# Patient Record
Sex: Female | Born: 2000
Health system: Southern US, Community
[De-identification: ages and names within clinical notes are randomized; demographics above are authoritative.]

## PROBLEM LIST (undated history)

## (undated) HISTORY — PX: NO PAST SURGERIES: SHX2092

---
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
## 2018-12-04 ENCOUNTER — Telehealth (INDEPENDENT_AMBULATORY_CARE_PROVIDER_SITE_OTHER): Payer: Self-pay | Admitting: Women's Health

## 2018-12-04 ENCOUNTER — Other Ambulatory Visit: Payer: Self-pay

## 2018-12-04 ENCOUNTER — Encounter: Payer: Self-pay | Admitting: Women's Health

## 2018-12-04 VITALS — Ht 66.0 in | Wt 234.0 lb

## 2018-12-04 DIAGNOSIS — N926 Irregular menstruation, unspecified: Secondary | ICD-10-CM

## 2018-12-04 DIAGNOSIS — N921 Excessive and frequent menstruation with irregular cycle: Secondary | ICD-10-CM

## 2018-12-04 NOTE — Progress Notes (Signed)
   TELEHEALTH VIRTUAL GYN VISIT ENCOUNTER NOTE Patient name: Deanna Harper MRN [id number]  Date of birth: Aug 08, 2000  I connected with patient on 12/04/18 at  2:00 PM EDT by Mychart video and verified that I am speaking with the correct person using two identifiers.  Due to COVID-19 recommendations, pt is not currently in the office.    I discussed the limitations, risks, security and privacy concerns of performing an evaluation and management service by telephone and the availability of in person appointments. I also discussed with the patient that there may be a patient responsible charge related to this service. The patient expressed understanding and agreed to proceed.   Chief Complaint:   consulatation birth control  History of Present Illness:   Deanna Harper is a 18 y.o. G41 Hispanic female being evaluated today to discuss getting IUD to control periods.  Menarche @ 11-12yo, were normally regular, would last 9 days, tampon/pads q 30-12min- was put on COCs 39yrs ago to control.  Has been on continuous COCs x 3 yrs and has been having 2 week periods, this last one has been 7 weeks, and still on it. +cramping, clots quarter-sized. Sexually active.   Patient's last menstrual period was 10/24/2018. The current method of family planning is OCP (estrogen/progesterone).  Last pap <21yo. Results were:  n/a Review of Systems:   Pertinent items are noted in HPI Denies fever/chills, dizziness, headaches, visual disturbances, fatigue, shortness of breath, chest pain, abdominal pain, vomiting, abnormal vaginal discharge/itching/odor/irritation, problems with periods, bowel movements, urination, or intercourse unless otherwise stated above.  Pertinent History Reviewed:  Reviewed past medical,surgical, social, obstetrical and family history.  Reviewed problem list, medications and allergies. Physical Assessment:   Vitals:   12/04/18 1423  Weight: 234 lb (106.1 kg)  Height: 5\' 6"  (1.676 m)  Body  mass index is 37.77 kg/m.       Physical Examination:   General:  Alert, oriented and cooperative.   Mental Status: Normal mood and affect perceived. Normal judgment and thought content.  Physical exam deferred due to nature of the encounter  No results found for this or any previous visit (from the past 24 hour(s)).  Assessment & Plan:  1) Heavy prolonged periods> wants Mirena, on period now, will try to get in asap, if period stops prior to appt call and reschedule for when on period  Meds: No orders of the defined types were placed in this encounter.   No orders of the defined types were placed in this encounter.   I discussed the assessment and treatment plan with the patient. The patient was provided an opportunity to ask questions and all were answered. The patient agreed with the plan and demonstrated an understanding of the instructions.   The patient was advised to call back or seek an in-person evaluation/go to the ED if the symptoms worsen or if the condition fails to improve as anticipated.  I provided 15 minutes of non-face-to-face time during this encounter.   Return for asap for , IUD insertion.  [place], [facility] 12/04/2018 2:39 PM

## 2018-12-17 ENCOUNTER — Other Ambulatory Visit: Payer: Self-pay

## 2018-12-17 ENCOUNTER — Ambulatory Visit (INDEPENDENT_AMBULATORY_CARE_PROVIDER_SITE_OTHER): Payer: Medicaid Other | Admitting: Women's Health

## 2018-12-17 ENCOUNTER — Encounter: Payer: Self-pay | Admitting: Women's Health

## 2018-12-17 VITALS — BP 136/77 | HR 106 | Ht 66.0 in | Wt 233.5 lb

## 2018-12-17 DIAGNOSIS — Z30430 Encounter for insertion of intrauterine contraceptive device: Secondary | ICD-10-CM

## 2018-12-17 DIAGNOSIS — Z3202 Encounter for pregnancy test, result negative: Secondary | ICD-10-CM

## 2018-12-17 DIAGNOSIS — N921 Excessive and frequent menstruation with irregular cycle: Secondary | ICD-10-CM | POA: Diagnosis not present

## 2018-12-17 DIAGNOSIS — Z113 Encounter for screening for infections with a predominantly sexual mode of transmission: Secondary | ICD-10-CM

## 2018-12-17 LAB — POCT URINE PREGNANCY: Preg Test, Ur: NEGATIVE

## 2018-12-17 LAB — POCT HEMOGLOBIN: Hemoglobin: 14.7 g/dL — AB (ref 11–14.6)

## 2018-12-17 MED ORDER — LEVONORGESTREL 20 MCG/24HR IU IUD
INTRAUTERINE_SYSTEM | Freq: Once | INTRAUTERINE | Status: AC
  Administered 2018-12-17: 14:00:00 via INTRAUTERINE

## 2018-12-17 NOTE — Patient Instructions (Signed)
 Nothing in vagina for 3 days (no sex, douching, tampons, etc...)  Check your strings once a month to make sure you can feel them, if you are not able to please let us know  If you develop a fever of 100.4 or more in the next few weeks, or if you develop severe abdominal pain, please let us know  Use a backup method of birth control, such as condoms, for 2 weeks    Intrauterine Device Insertion, Care After  This sheet gives you information about how to care for yourself after your procedure. Your health care provider may also give you more specific instructions. If you have problems or questions, contact your health care provider. What can I expect after the procedure? After the procedure, it is common to have:  Cramps and pain in the abdomen.  Light bleeding (spotting) or heavier bleeding that is like your menstrual period. This may last for up to a few days.  Lower back pain.  Dizziness.  Headaches.  Nausea. Follow these instructions at home:  Before resuming sexual activity, check to make sure that you can feel the IUD string(s). You should be able to feel the end of the string(s) below the opening of your cervix. If your IUD string is in place, you may resume sexual activity. ? If you had a hormonal IUD inserted more than 7 days after your most recent period started, you will need to use a backup method of birth control for 7 days after IUD insertion. Ask your health care provider whether this applies to you.  Continue to check that the IUD is still in place by feeling for the string(s) after every menstrual period, or once a month.  Take over-the-counter and prescription medicines only as told by your health care provider.  Do not drive or use heavy machinery while taking prescription pain medicine.  Keep all follow-up visits as told by your health care provider. This is important. Contact a health care provider if:  You have bleeding that is heavier or lasts longer than  a normal menstrual cycle.  You have a fever.  You have cramps or abdominal pain that get worse or do not get better with medicine.  You develop abdominal pain that is new or is not in the same area of earlier cramping and pain.  You feel lightheaded or weak.  You have abnormal or bad-smelling discharge from your vagina.  You have pain during sexual activity.  You have any of the following problems with your IUD string(s): ? The string bothers or hurts you or your sexual partner. ? You cannot feel the string. ? The string has gotten longer.  You can feel the IUD in your vagina.  You think you may be pregnant, or you miss your menstrual period.  You think you may have an STI (sexually transmitted infection). Get help right away if:  You have flu-like symptoms.  You have a fever and chills.  You can feel that your IUD has slipped out of place. Summary  After the procedure, it is common to have cramps and pain in the abdomen. It is also common to have light bleeding (spotting) or heavier bleeding that is like your menstrual period.  Continue to check that the IUD is still in place by feeling for the string(s) after every menstrual period, or once a month.  Keep all follow-up visits as told by your health care provider. This is important.  Contact your health care provider if   you have problems with your IUD string(s), such as the string getting longer or bothering you or your sexual partner. This information is not intended to replace advice given to you by your health care provider. Make sure you discuss any questions you have with your health care provider. Document Released: 12/13/2010 Document Revised: 03/29/2017 Document Reviewed: 03/07/2016 Elsevier Patient Education  2020 Elsevier Inc.  

## 2018-12-17 NOTE — Addendum Note (Signed)
Addended by: Linton Rump on: 12/17/2018 01:43 PM   Modules accepted: Orders

## 2018-12-17 NOTE — Progress Notes (Signed)
   IUD INSERTION Patient name: Deanna Harper MRN [id number]  Date of birth: 13-Oct-2000 Subjective Findings:   Deanna Harper is a 18 y.o. [place] female being seen today for insertion of a Mirena IUD to help control heavy prolonged periods.   Patient's last menstrual period was 10/25/2018. Last sexual intercourse was >2wks ago Last pap <21yo. Results were:  n/a  The risks and benefits of the method and placement have been thouroughly reviewed with the patient and all questions were answered.  Specifically the patient is aware of failure rate of 04/998, expulsion of the IUD and of possible perforation.  The patient is aware of irregular bleeding due to the method and understands the incidence of irregular bleeding diminishes with time.  Signed copy of informed consent in chart.  Pertinent History Reviewed:   Reviewed past medical,surgical, social, obstetrical and family history.  Reviewed problem list, medications and allergies. Objective Findings & Procedure:   Vitals:   12/17/18 1001  BP: 136/77  Pulse: (!) 106  Weight: 233 lb 8 oz (105.9 kg)  Height: 5\' 6"  (1.676 m)  Body mass index is 37.69 kg/m.  Results for orders placed or performed in visit on 12/17/18 (from the past 24 hour(s))  POCT urine pregnancy   Collection Time: 12/17/18 11:35 AM  Result Value Ref Range   Preg Test, Ur Negative Negative  POCT hemoglobin   Collection Time: 12/17/18 12:18 PM  Result Value Ref Range   Hemoglobin 14.7 (A) 11 - 14.6 g/dL     Time out was performed.  A graves speculum was placed in the vagina.  The cervix was visualized, prepped using Betadine, and grasped with a single tooth tenaculum. The uterus was found to be neutral and it sounded to 9 cm.  Mirena  IUD placed per manufacturer's recommendations. The strings were trimmed to approximately 3 cm. The patient tolerated the procedure well.   Informal transvaginal sonogram was performed and the proper placement of the IUD was  verified. Assessment & Plan:   1) Mirena IUD insertion The patient was given post procedure instructions, including signs and symptoms of infection and to check for the strings after each menses or each month, and refraining from intercourse or anything in the vagina for 3 days. She was given a care card with date IUD placed, and date IUD to be removed. She is scheduled for a f/u appointment in 4 weeks.  2) STD screen> gc/ct today  Orders Placed This Encounter  Procedures  . GC/Chlamydia Probe Amp  . POCT urine pregnancy  . POCT hemoglobin    Return in about 4 weeks (around 01/14/2019) for F/U, MyChart Video.  [place], [facility] 12/17/2018 1:29 PM

## 2018-12-20 LAB — GC/CHLAMYDIA PROBE AMP
Chlamydia trachomatis, NAA: NEGATIVE
Neisseria Gonorrhoeae by PCR: NEGATIVE

## 2019-01-14 ENCOUNTER — Encounter: Payer: Self-pay | Admitting: Women's Health

## 2019-01-14 ENCOUNTER — Telehealth (INDEPENDENT_AMBULATORY_CARE_PROVIDER_SITE_OTHER): Payer: Medicaid Other | Admitting: Women's Health

## 2019-01-14 ENCOUNTER — Other Ambulatory Visit: Payer: Self-pay

## 2019-01-14 VITALS — Ht 66.0 in | Wt 234.0 lb

## 2019-01-14 DIAGNOSIS — Z30431 Encounter for routine checking of intrauterine contraceptive device: Secondary | ICD-10-CM

## 2019-01-14 NOTE — Progress Notes (Signed)
   TELEHEALTH VIRTUAL GYN VISIT ENCOUNTER NOTE Patient name: Deanna Harper MRN [id number]  Date of birth: 2000/10/21  I connected with patient on 01/14/19 at  9:50 AM EDT by MyChart video  and verified that I am speaking with the correct person using two identifiers.  Due to COVID-19 recommendations, pt is not currently in the office.    I discussed the limitations, risks, security and privacy concerns of performing an evaluation and management service by telephone and the availability of in person appointments. I also discussed with the patient that there may be a patient responsible charge related to this service. The patient expressed understanding and agreed to proceed.   Chief Complaint:   iud check (cramping)  History of Present Illness:   Deanna Harper is a 18 y.o. [place] female being evaluated today for IUD f/u. Had Mirena placed 12/17/18 for heavy prolonged periods. Able to feel strings. Has had intermittent bleeding/spotting and cramping, sometimes cramps are pretty intense. Hasn't had to take anything for them.     Patient's last menstrual period was 12/20/2018. The current method of family planning is IUD.  Last pap <21yo. Results were:  n/a Review of Systems:   Pertinent items are noted in HPI Denies fever/chills, dizziness, headaches, visual disturbances, fatigue, shortness of breath, chest pain, abdominal pain, vomiting, abnormal vaginal discharge/itching/odor/irritation, problems with periods, bowel movements, urination, or intercourse unless otherwise stated above.  Pertinent History Reviewed:  Reviewed past medical,surgical, social, obstetrical and family history.  Reviewed problem list, medications and allergies. Physical Assessment:   Vitals:   01/14/19 1014  Weight: 234 lb (106.1 kg)  Height: 5\' 6"  (1.676 m)  Body mass index is 37.77 kg/m.       Physical Examination:   General:  Alert, oriented and cooperative.   Mental Status: Normal mood and affect  perceived. Normal judgment and thought content.  Physical exam deferred due to nature of the encounter  No results found for this or any previous visit (from the past 24 hour(s)).  Assessment & Plan:  1) IUD f/u> doing well overall, can take up to 33mths for bleeding to improve, take ibuprofen prn cramping, check strings monthly  Meds: No orders of the defined types were placed in this encounter.   No orders of the defined types were placed in this encounter.   I discussed the assessment and treatment plan with the patient. The patient was provided an opportunity to ask questions and all were answered. The patient agreed with the plan and demonstrated an understanding of the instructions.   The patient was advised to call back or seek an in-person evaluation/go to the ED if the symptoms worsen or if the condition fails to improve as anticipated.  I provided 10 minutes of non-face-to-face time during this encounter.   Return for after 2/7 for , Pap & physical.  Roma Schanz CNM, [facility] 01/14/2019 10:28 AM

## 2019-06-16 ENCOUNTER — Encounter: Payer: Self-pay | Admitting: Adult Health

## 2019-06-16 ENCOUNTER — Other Ambulatory Visit: Payer: Self-pay

## 2019-06-16 ENCOUNTER — Ambulatory Visit (INDEPENDENT_AMBULATORY_CARE_PROVIDER_SITE_OTHER): Payer: Self-pay | Admitting: Adult Health

## 2019-06-16 VITALS — BP 151/97 | HR 111 | Ht 66.0 in | Wt 237.4 lb

## 2019-06-16 DIAGNOSIS — N946 Dysmenorrhea, unspecified: Secondary | ICD-10-CM

## 2019-06-16 DIAGNOSIS — N920 Excessive and frequent menstruation with regular cycle: Secondary | ICD-10-CM

## 2019-06-16 DIAGNOSIS — Z975 Presence of (intrauterine) contraceptive device: Secondary | ICD-10-CM | POA: Insufficient documentation

## 2019-06-16 MED ORDER — MEGESTROL ACETATE 40 MG PO TABS: ORAL_TABLET | ORAL | 1 refills | Status: DC

## 2019-06-16 NOTE — Progress Notes (Signed)
  Subjective:     Patient ID: Deanna Harper, female   DOB: 28-Jul-2000, 19 y.o.   MRN: [id number]  HPI Deanna Harper is a 19 year old white female, single, G0P0, in to talk about options, for heavy, long periods has IUD, since 11/2018.  PCP is Dr Sherwood Gambler.   Review of Systems Got IUD 11/2018, and periods have been monthly and last 10-11 days Has period cramps and they start before period No sex lately Started period in 6th grade and has always been heavy, even OCs did not help  Reviewed past medical,surgical, social and family history. Reviewed medications and allergies.     Objective:   Physical Exam BP (!) 151/97 (BP Location: Left Arm, Patient Position: Sitting, Cuff Size: Normal)   Pulse (!) 111   Ht 5\' 6"  (1.676 m)   Wt 237 lb 6.4 oz (107.7 kg)   BMI 38.32 kg/m  Skin warm and dry. Neck: mid line trachea, normal thyroid, good ROM, no lymphadenopathy noted. Lungs: clear to ausculation bilaterally. Cardiovascular: regular rate and rhythm.    Assessment:     1. Menorrhagia with regular cycle Will rx megace Meds ordered this encounter  Medications  . megestrol (MEGACE) 40 MG tablet    Sig: Take 3 x 5 days then 2 x 5 days then 1 daily    Dispense:  45 tablet    Refill:  1    Order Specific Question:   Supervising Provider    Answer:   , LUTHER H [[id number]]   2. IUD (intrauterine device) in place   3. Dysmenorrhea    Plan:     Return in 4 weeks for pap and physical

## 2019-07-10 ENCOUNTER — Other Ambulatory Visit: Payer: Self-pay | Admitting: Adult Health

## 2019-07-15 ENCOUNTER — Other Ambulatory Visit: Payer: Self-pay

## 2019-07-15 ENCOUNTER — Ambulatory Visit (INDEPENDENT_AMBULATORY_CARE_PROVIDER_SITE_OTHER): Payer: Medicaid Other | Admitting: Adult Health

## 2019-07-15 ENCOUNTER — Other Ambulatory Visit (HOSPITAL_COMMUNITY)
Admission: RE | Admit: 2019-07-15 | Discharge: 2019-07-15 | Disposition: A | Discharge status: Home / Self Care | Payer: Medicaid Other | Source: Ambulatory Visit | Attending: Adult Health | Admitting: Adult Health

## 2019-07-15 ENCOUNTER — Encounter: Payer: Self-pay | Admitting: Adult Health

## 2019-07-15 VITALS — BP 153/96 | HR 96 | Ht 66.0 in | Wt 237.0 lb

## 2019-07-15 DIAGNOSIS — Z975 Presence of (intrauterine) contraceptive device: Secondary | ICD-10-CM | POA: Diagnosis not present

## 2019-07-15 DIAGNOSIS — N898 Other specified noninflammatory disorders of vagina: Secondary | ICD-10-CM | POA: Insufficient documentation

## 2019-07-15 DIAGNOSIS — Z01419 Encounter for gynecological examination (general) (routine) without abnormal findings: Secondary | ICD-10-CM | POA: Diagnosis present

## 2019-07-15 DIAGNOSIS — Z113 Encounter for screening for infections with a predominantly sexual mode of transmission: Secondary | ICD-10-CM | POA: Insufficient documentation

## 2019-07-15 DIAGNOSIS — B9689 Other specified bacterial agents as the cause of diseases classified elsewhere: Secondary | ICD-10-CM | POA: Insufficient documentation

## 2019-07-15 DIAGNOSIS — N760 Acute vaginitis: Secondary | ICD-10-CM | POA: Diagnosis not present

## 2019-07-15 DIAGNOSIS — Z3009 Encounter for other general counseling and advice on contraception: Secondary | ICD-10-CM | POA: Insufficient documentation

## 2019-07-15 LAB — POCT WET PREP (WET MOUNT)
Clue Cells Wet Prep Whiff POC: POSITIVE
Trichomonas Wet Prep HPF POC: ABSENT
WBC, Wet Prep HPF POC: POSITIVE

## 2019-07-15 MED ORDER — METRONIDAZOLE 500 MG PO TABS: 500.0000 mg | ORAL_TABLET | Freq: Two times a day (BID) | ORAL | 0 refills | Status: DC

## 2019-07-15 NOTE — Progress Notes (Signed)
Patient ID: Deanna Harper, female   DOB: August 20, 2000, 19 y.o.   MRN: [id number] History of Present Illness:  Deanna Harper is a 19 year old white female, single, G0P0, has IUD in for well woman gyn exam and first pap.Is taking megace for bleeding with IUD and it is better. She has [employer].  PCP is Dr Sherwood Gambler.   Current Medications, Allergies, Past Medical History, Past Surgical History, Family History and Social History were reviewed in [employer] electronic medical record.     Review of Systems:  Patient denies any headaches, hearing loss, fatigue, blurred vision, shortness of breath, chest pain, abdominal pain, problems with bowel movements, urination, or intercourse. No joint pain or mood swings. Bleeding has almost stopped, taking 2 megace daily    Physical Exam:BP (!) 153/96 (BP Location: Left Arm, Patient Position: Sitting, Cuff Size: Normal)   Pulse 96   Ht 5\' 6"  (1.676 m)   Wt 237 lb (107.5 kg)   BMI 38.25 kg/m  General:  Well developed, well nourished, no acute distress Skin:  Warm and dry Neck:  Midline trachea, normal thyroid, good ROM, no lymphadenopathy Lungs; Clear to auscultation bilaterally Breast:  No dominant palpable mass, retraction, or nipple discharge,has bilateral nipple rods  Cardiovascular: Regular rate and rhythm Abdomen:  Soft, non tender, no hepatosplenomegaly Pelvic:  External genitalia is normal in appearance, no lesions.  The vagina is normal in appearance, scant white discharge with odor. Urethra has no lesions or masses. The cervix is smooth, +IUD strings, pap with GC/CHL performed, cervix friable with EC brush.  Uterus is felt to be normal size, shape, and contour.  No adnexal masses or tenderness noted.Bladder is non tender, no masses felt. Wet prep: +WBCs and clue cells, no trich seen  Extremities/musculoskeletal:  No swelling or varicosities noted, no clubbing or cyanosis Psych:  No mood changes, alert and cooperative,seems happy Fall  risk is low PHQ 2 score is 0. Examination chaperoned by Marland Kitchen CMA.   Impression and Plan:  1. Encounter for gynecological examination with Papanicolaou smear of cervix Pap sent Physical in 1 year Pap in 3 if normal  2. Family planning Check HIV and RPR  3. Screening examination for STD (sexually transmitted disease) Check HIV and RPR  4. Vaginal odor Rx flagyl  5. Vaginal discharge Rx flagyl   6. BV (bacterial vaginosis) Rx flagyl 500 mg 1 bid x 7 days, no alcohol, review handout on BV      7. IUD (intrauterine device) in place

## 2019-07-15 NOTE — Patient Instructions (Signed)
Bacterial Vaginosis  Bacterial vaginosis is a vaginal infection that occurs when the normal balance of bacteria in the vagina is disrupted. It results from an overgrowth of certain bacteria. This is the most common vaginal infection among women ages 15-44. Because bacterial vaginosis increases your risk for STIs (sexually transmitted infections), getting treated can help reduce your risk for chlamydia, gonorrhea, herpes, and HIV (human immunodeficiency virus). Treatment is also important for preventing complications in pregnant women, because this condition can cause an early (premature) delivery. What are the causes? This condition is caused by an increase in harmful bacteria that are normally present in small amounts in the vagina. However, the reason that the condition develops is not fully understood. What increases the risk? The following factors may make you more likely to develop this condition:  Having a new sexual partner or multiple sexual partners.  Having unprotected sex.  Douching.  Having an intrauterine device (IUD).  Smoking.  Drug and alcohol abuse.  Taking certain antibiotic medicines.  Being pregnant. You cannot get bacterial vaginosis from toilet seats, bedding, swimming pools, or contact with objects around you. What are the signs or symptoms? Symptoms of this condition include:  Grey or white vaginal discharge. The discharge can also be watery or foamy.  A fish-like odor with discharge, especially after sexual intercourse or during menstruation.  Itching in and around the vagina.  Burning or pain with urination. Some women with bacterial vaginosis have no signs or symptoms. How is this diagnosed? This condition is diagnosed based on:  Your medical history.  A physical exam of the vagina.  Testing a sample of vaginal fluid under a microscope to look for a large amount of bad bacteria or abnormal cells. Your health care provider may use a cotton swab or  a small wooden spatula to collect the sample. How is this treated? This condition is treated with antibiotics. These may be given as a pill, a vaginal cream, or a medicine that is put into the vagina (suppository). If the condition comes back after treatment, a second round of antibiotics may be needed. Follow these instructions at home: Medicines  Take over-the-counter and prescription medicines only as told by your health care provider.  Take or use your antibiotic as told by your health care provider. Do not stop taking or using the antibiotic even if you start to feel better. General instructions  If you have a female sexual partner, tell her that you have a vaginal infection. She should see her health care provider and be treated if she has symptoms. If you have a female sexual partner, he does not need treatment.  During treatment: ? Avoid sexual activity until you finish treatment. ? Do not douche. ? Avoid alcohol as directed by your health care provider. ? Avoid breastfeeding as directed by your health care provider.  Drink enough water and fluids to keep your urine clear or pale yellow.  Keep the area around your vagina and rectum clean. ? Wash the area daily with warm water. ? Wipe yourself from front to back after using the toilet.  Keep all follow-up visits as told by your health care provider. This is important. How is this prevented?  Do not douche.  Wash the outside of your vagina with warm water only.  Use protection when having sex. This includes latex condoms and dental dams.  Limit how many sexual partners you have. To help prevent bacterial vaginosis, it is best to have sex with just one partner (  monogamous).  Make sure you and your sexual partner are tested for STIs.  Wear cotton or cotton-lined underwear.  Avoid wearing tight pants and pantyhose, especially during summer.  Limit the amount of alcohol that you drink.  Do not use any products that contain  nicotine or tobacco, such as cigarettes and e-cigarettes. If you need help quitting, ask your health care provider.  Do not use illegal drugs. Where to find more information  Centers for Disease Control and Prevention: www.cdc.gov/std  American Sexual Health Association (ASHA): www.ashastd.org  U.S. Department of Health and Human Services, Office on Women's Health: www.womenshealth.gov/ or https://www.womenshealth.gov/a-z-topics/bacterial-vaginosis Contact a health care provider if:  Your symptoms do not improve, even after treatment.  You have more discharge or pain when urinating.  You have a fever.  You have pain in your abdomen.  You have pain during sex.  You have vaginal bleeding between periods. Summary  Bacterial vaginosis is a vaginal infection that occurs when the normal balance of bacteria in the vagina is disrupted.  Because bacterial vaginosis increases your risk for STIs (sexually transmitted infections), getting treated can help reduce your risk for chlamydia, gonorrhea, herpes, and HIV (human immunodeficiency virus). Treatment is also important for preventing complications in pregnant women, because the condition can cause an early (premature) delivery.  This condition is treated with antibiotic medicines. These may be given as a pill, a vaginal cream, or a medicine that is put into the vagina (suppository). This information is not intended to replace advice given to you by your health care provider. Make sure you discuss any questions you have with your health care provider. Document Revised: 03/29/2017 Document Reviewed: 12/31/2015 Elsevier Patient Education  2020 Elsevier Inc.  

## 2019-07-16 LAB — HIV ANTIBODY (ROUTINE TESTING W REFLEX): HIV Screen 4th Generation wRfx: NONREACTIVE

## 2019-07-16 LAB — RPR: RPR Ser Ql: NONREACTIVE

## 2019-07-17 LAB — CYTOLOGY - PAP
Chlamydia: NEGATIVE
Comment: NEGATIVE
Comment: NORMAL
Diagnosis: NEGATIVE
Neisseria Gonorrhea: NEGATIVE

## 2019-08-25 ENCOUNTER — Encounter: Payer: Self-pay | Admitting: Adult Health

## 2019-08-25 ENCOUNTER — Ambulatory Visit: Payer: Self-pay | Admitting: Adult Health

## 2019-08-25 ENCOUNTER — Other Ambulatory Visit: Payer: Self-pay

## 2019-08-25 VITALS — BP 126/88 | HR 95 | Ht 66.0 in | Wt 241.0 lb

## 2019-08-25 DIAGNOSIS — N926 Irregular menstruation, unspecified: Secondary | ICD-10-CM | POA: Insufficient documentation

## 2019-08-25 DIAGNOSIS — Z975 Presence of (intrauterine) contraceptive device: Secondary | ICD-10-CM

## 2019-08-25 DIAGNOSIS — N9489 Other specified conditions associated with female genital organs and menstrual cycle: Secondary | ICD-10-CM | POA: Insufficient documentation

## 2019-08-25 MED ORDER — MEGESTROL ACETATE 40 MG PO TABS: ORAL_TABLET | ORAL | 2 refills | Status: DC

## 2019-08-25 NOTE — Progress Notes (Signed)
  Subjective:     Patient ID: Deanna Harper, female   DOB: 26-Mar-2001, 19 y.o.   MRN: [id number]  HPI Deanna Harper is a 19 year old white female,single, G0P0 in complaining of prolonged period and cramping, has IUD.She took megace and it stopped the last episode, but needs refill on megace.She does not want to remove IUD. PCP is Dr Sherwood Gambler.   Review of Systems Prolonged period +cramping  Reviewed past medical,surgical, social and family history. Reviewed medications and allergies.     Objective:   Physical Exam BP 126/88 (BP Location: Left Arm, Patient Position: Sitting, Cuff Size: Normal)   Pulse 95   Ht 5\' 6"  (1.676 m)   Wt 241 lb (109.3 kg)   LMP 08/05/2019   BMI 38.90 kg/m  Skin warm and dry.  Lungs: clear to ausculation bilaterally. Cardiovascular: regular rate and rhythm.    Assessment:     1. Prolonged periods Will rx megace Meds ordered this encounter  Medications  . megestrol (MEGACE) 40 MG tablet    Sig: TAKE 3 TABLETS FOR 5 DAYS, TAKE 2 TABLETS FOR 5 DAYS,TAKE 1 TABLET DAILY    Dispense:  45 tablet    Refill:  2    Order Specific Question:   Supervising Provider    Answer:   10/05/2019, LUTHER H [[id number]]    2. IUD (intrauterine device) in place   3. Uterine cramping     Plan:     Take megace and follow up with me in 6 weeks

## 2019-09-15 ENCOUNTER — Other Ambulatory Visit: Payer: Self-pay | Admitting: Adult Health

## 2019-10-06 ENCOUNTER — Ambulatory Visit (INDEPENDENT_AMBULATORY_CARE_PROVIDER_SITE_OTHER): Payer: Self-pay | Admitting: Adult Health

## 2019-10-06 ENCOUNTER — Encounter: Payer: Self-pay | Admitting: Adult Health

## 2019-10-06 VITALS — BP 98/72 | HR 90 | Ht 66.0 in | Wt 251.0 lb

## 2019-10-06 DIAGNOSIS — Z6841 Body Mass Index (BMI) 40.0 and over, adult: Secondary | ICD-10-CM

## 2019-10-06 DIAGNOSIS — T50905A Adverse effect of unspecified drugs, medicaments and biological substances, initial encounter: Secondary | ICD-10-CM

## 2019-10-06 DIAGNOSIS — R635 Abnormal weight gain: Secondary | ICD-10-CM | POA: Insufficient documentation

## 2019-10-06 DIAGNOSIS — Z975 Presence of (intrauterine) contraceptive device: Secondary | ICD-10-CM

## 2019-10-06 NOTE — Progress Notes (Signed)
  Subjective:     Patient ID: Danella Deis, female   DOB: Jan 01, 2001, 19 y.o.   MRN: [id number]  HPI Davelyn is a 19 year old white female, single, G0P0, back in for bleeding on IUD and started megace and bleeding has stopped but gained weight PCP is Dr Sherwood Gambler.   Review of Systems Bleeding stopped with megace Has gained weight with megace Reviewed past medical,surgical, social and family history. Reviewed medications and allergies.     Objective:   Physical Exam BP 98/72 (BP Location: Left Arm, Patient Position: Sitting, Cuff Size: Large)   Pulse 90   Ht 5\' 6"  (1.676 m)   Wt 251 lb (113.9 kg)   BMI 40.51 kg/m  Skin warm and dry. Neck: mid line trachea, normal thyroid, good ROM, no lymphadenopathy noted. Lungs: clear to ausculation bilaterally. Cardiovascular: regular rate and rhythm. Has gained 10 lbs since 08/25/19    Assessment:     1. Weight gain due to medication Stop megace for now  2. IUD (intrauterine device) in place IF bleeding should return may try OCs to control     Plan:     Follow up prn

## 2019-10-09 ENCOUNTER — Ambulatory Visit (INDEPENDENT_AMBULATORY_CARE_PROVIDER_SITE_OTHER): Payer: Self-pay | Admitting: Nurse Practitioner

## 2019-10-09 ENCOUNTER — Encounter: Payer: Self-pay | Admitting: Nurse Practitioner

## 2019-10-09 VITALS — BP 144/89 | HR 98 | Ht 66.0 in | Wt 251.0 lb

## 2019-10-09 DIAGNOSIS — Z30431 Encounter for routine checking of intrauterine contraceptive device: Secondary | ICD-10-CM

## 2019-10-09 NOTE — Progress Notes (Signed)
   GYNECOLOGY OFFICE VISIT NOTE   History:  19 y.o. G0P0000 here today for IUD concerns - partner is feeling something hard and client has had problems with string poking her and feeling the strings at the introitus since the IUD was inserted. She denies any abnormal vaginal discharge, bleeding, pelvic pain or other concerns.   History reviewed. No pertinent past medical history.  Past Surgical History:  Procedure Laterality Date  . NO PAST SURGERIES      The following portions of the patient's history were reviewed and updated as appropriate: allergies, current medications, past family history, past medical history, past social history, past surgical history and problem list.     Review of Systems:  Pertinent items noted in HPI and remainder of comprehensive ROS otherwise negative.  Objective:  Physical Exam BP (!) 144/89 (BP Location: Left Arm, Patient Position: Sitting, Cuff Size: Normal)   Pulse 98   Ht 5\' 6"  (1.676 m)   Wt 251 lb (113.9 kg)   BMI 40.51 kg/m  CONSTITUTIONAL: Well-developed, well-nourished female in no acute distress.  HENT:  Normocephalic, atraumatic. External right and left ear normal.  EYES: Conjunctivae and EOM are normal. Pupils are equal, round.  No scleral icterus.  NECK: Normal range of motion, supple, SKIN: Skin is warm and dry. No rash noted. Not diaphoretic. No erythema. No pallor. NEUROLOGIC: Alert and oriented to person, place, and time. Normal muscle tone coordination. No cranial nerve deficit noted. PSYCHIATRIC: Normal mood and affect. Normal behavior. Normal judgment and thought content. CARDIOVASCULAR: Normal heart rate noted RESPIRATORY: Effort and breath sounds normal, no problems with respiration noted ABDOMEN: Soft, no distention noted.   PELVIC: Speculum exam - no vaginal discharge, IUD strings visualized and wrapping behind the cervix.  No part of the IUD seen in the os.  Strings trimmed 0.5 cm with perpendicual angle.  No part of the  IUD palpated on bimanual. MUSCULOSKELETAL: Normal range of motion. No edema noted.  Labs and Imaging No results found.  Assessment & Plan:  1. IUD check up Strings trimmed and client reassured IUD is in the appropriate place.  Discussed partner's concerns and likely partner feels the cervix in intercourse. Advised to return if still having problems feeling the strings at the introitus. Advised to push strings deep into the vagina if feeling the strings at the introitus. Return with any concerns with the IUD. Will need to watch BP. - elevated without diagnosis of hypertension  Routine preventative health maintenance measures emphasized. Please refer to After Visit Summary for other counseling recommendations.   Return for annual exam.   Total face-to-face time with patient: 19 minutes.  Over 50% of encounter was spent on counseling and coordination of care.  Marland Kitchen, RN, MSN, NP-BC Nurse Practitioner, [facility] for [facility] 10/09/2019 11:32 AM

## 2019-10-20 ENCOUNTER — Other Ambulatory Visit: Payer: Self-pay | Admitting: Adult Health

## 2019-10-20 MED ORDER — MEGESTROL ACETATE 40 MG PO TABS: ORAL_TABLET | ORAL | 0 refills | Status: DC

## 2019-10-20 NOTE — Progress Notes (Signed)
Refill megace  

## 2019-12-01 ENCOUNTER — Other Ambulatory Visit: Payer: Self-pay

## 2019-12-02 MED ORDER — MEGESTROL ACETATE 40 MG PO TABS: ORAL_TABLET | ORAL | 0 refills | Status: DC

## 2019-12-09 ENCOUNTER — Telehealth: Payer: Self-pay | Admitting: Adult Health

## 2019-12-09 NOTE — Telephone Encounter (Signed)
Deanna Harper is having bleeding with liletta, and megace not helping, so stop megace. She had migraines with auras, so want to avoid estrogen  Call me in follow up in 3-5 days

## 2019-12-10 ENCOUNTER — Other Ambulatory Visit: Payer: Self-pay | Admitting: Adult Health

## 2019-12-10 MED ORDER — ESTRADIOL 1 MG PO TABS: 1.0000 mg | ORAL_TABLET | Freq: Every day | ORAL | 0 refills | Status: DC

## 2019-12-10 NOTE — Progress Notes (Signed)
Will try estrace to stop bleeding

## 2019-12-27 ENCOUNTER — Other Ambulatory Visit: Payer: Self-pay

## 2019-12-28 ENCOUNTER — Other Ambulatory Visit: Payer: Self-pay | Admitting: Adult Health

## 2019-12-28 MED ORDER — MEGESTROL ACETATE 40 MG PO TABS: ORAL_TABLET | ORAL | 0 refills | Status: DC

## 2019-12-28 NOTE — Progress Notes (Signed)
Refilled megace  

## 2020-01-08 ENCOUNTER — Ambulatory Visit
Admission: EM | Admit: 2020-01-08 | Discharge: 2020-01-08 | Disposition: A | Discharge status: Home / Self Care | Payer: Medicaid Other | Attending: Emergency Medicine | Admitting: Emergency Medicine

## 2020-01-08 ENCOUNTER — Ambulatory Visit (INDEPENDENT_AMBULATORY_CARE_PROVIDER_SITE_OTHER): Payer: Medicaid Other

## 2020-01-08 ENCOUNTER — Encounter: Payer: Self-pay | Admitting: Emergency Medicine

## 2020-01-08 ENCOUNTER — Other Ambulatory Visit: Payer: Self-pay

## 2020-01-08 DIAGNOSIS — M25562 Pain in left knee: Secondary | ICD-10-CM

## 2020-01-08 MED ORDER — IBUPROFEN 800 MG PO TABS: 800.0000 mg | ORAL_TABLET | Freq: Three times a day (TID) | ORAL | 0 refills | Status: DC

## 2020-01-08 NOTE — ED Provider Notes (Signed)
[facility]   [id number] 01/08/20 Arrival Time: 1015   Chief Complaint  Patient presents with  . Knee Pain     SUBJECTIVE: History from: patient.  Deanna Harper is a 19 y.o. female who presented to the urgent care for complaint of left knee pain that started yesterday..  Reports she fell landing on her left knee.  She localizes the pain to the left knee.  She describes the pain as constant and achy.  She has tried OTC medications without relief.  Her symptoms are made worse with ROM.  She denies similar symptoms in the past.  Denies chills, fever, nausea, vomiting, diarrhea  ROS: As per HPI.  All other pertinent ROS negative.     History reviewed. No pertinent past medical history. Past Surgical History:  Procedure Laterality Date  . NO PAST SURGERIES     No Known Allergies No current facility-administered medications on file prior to encounter.   Current Outpatient Medications on File Prior to Encounter  Medication Sig Dispense Refill  . estradiol (ESTRACE) 1 MG tablet Take 1 tablet (1 mg total) by mouth daily. 30 tablet 0  . levonorgestrel (MIRENA) 20 MCG/24HR IUD 1 each by Intrauterine route once.    . megestrol (MEGACE) 40 MG tablet TAKE 3 TABLETS x 5 DAYS; 2 TABLETS x 5 DAYS; 1 TABLET DAILY UNTIL GONE. 45 tablet 0  . SUMAtriptan (IMITREX) 50 MG tablet      Social History   Socioeconomic History  . Marital status: Single    Spouse name: Not on file  . Number of children: 0  . Years of education: Not on file  . Highest education level: Not on file  Occupational History  . Not on file  Tobacco Use  . Smoking status: Never Smoker  . Smokeless tobacco: Never Used  Vaping Use  . Vaping Use: Never used  Substance and Sexual Activity  . Alcohol use: No    Alcohol/week: 0.0 standard drinks  . Drug use: No  . Sexual activity: Yes    Birth control/protection: I.U.D.  Other Topics Concern  . Not on file  Social History Narrative  . Not on file    Social Determinants of Health   Financial Resource Strain:   . Difficulty of Paying Living Expenses: Not on file  Food Insecurity:   . Worried About Programme researcher, broadcasting/film/video in the Last Year: Not on file  . Ran Out of Food in the Last Year: Not on file  Transportation Needs:   . Lack of Transportation (Medical): Not on file  . Lack of Transportation (Non-Medical): Not on file  Physical Activity:   . Days of Exercise per Week: Not on file  . Minutes of Exercise per Session: Not on file  Stress:   . Feeling of Stress : Not on file  Social Connections:   . Frequency of Communication with Friends and Family: Not on file  . Frequency of Social Gatherings with Friends and Family: Not on file  . Attends Religious Services: Not on file  . Active Member of Clubs or Organizations: Not on file  . Attends Banker Meetings: Not on file  . Marital Status: Not on file  Intimate Partner Violence:   . Fear of Current or Ex-Partner: Not on file  . Emotionally Abused: Not on file  . Physically Abused: Not on file  . Sexually Abused: Not on file   Family History  Problem Relation Age of Onset  . Allergic  rhinitis Sister   . Asthma Sister   . Angioedema Neg Hx   . Eczema Neg Hx   . Immunodeficiency Neg Hx   . Urticaria Neg Hx     OBJECTIVE:  Vitals:   01/08/20 1113 01/08/20 1114  BP: 112/76   Pulse: (!) 105   Resp: 20   Temp: 98.3 F (36.8 C)   TempSrc: Oral   SpO2: 97%   Weight:  250 lb (113.4 kg)  Height:  5\' 6"  (1.676 m)     Physical Exam Vitals and nursing note reviewed.  Constitutional:      General: She is not in acute distress.    Appearance: Normal appearance. She is normal weight. She is not ill-appearing, toxic-appearing or diaphoretic.  HENT:     Head: Normocephalic.  Cardiovascular:     Rate and Rhythm: Normal rate and regular rhythm.     Pulses: Normal pulses.     Heart sounds: Normal heart sounds. No murmur heard.  No friction rub. No gallop.    Pulmonary:     Effort: Pulmonary effort is normal. No respiratory distress.     Breath sounds: Normal breath sounds. No stridor. No wheezing, rhonchi or rales.  Chest:     Chest wall: No tenderness.  Musculoskeletal:        General: Tenderness present.     Right knee: Normal.     Left knee: Tenderness present.     Comments: Patient is able to ambulate and bear weight with mild pain.  The left knee is without any obvious asymmetry or deformity when compared to the right knee.  No surface trauma, swelling, warmth, open lesion, wound present.  Tenderness to palpation of patella bone.  Neurovascular status intact.  Negative anterior drawer test  Neurological:     Mental Status: She is alert and oriented to person, place, and time.     LABS:  No results found for this or any previous visit (from the past 24 hour(s)).     RADIOLOGY:  DG Knee Complete 4 Views Left  Result Date: 01/08/2020 CLINICAL DATA:  Pain following fall EXAM: LEFT KNEE - COMPLETE 4+ VIEW COMPARISON:  None. FINDINGS: Frontal, lateral, and bilateral oblique views were obtained. No fracture or dislocation. No joint effusion. Joint spaces appear normal. No erosive change. IMPRESSION: No fracture, dislocation, or joint effusion. No evident arthropathy. Electronically Signed   By: 03/09/2020 III M.D.   On: 01/08/2020 11:30   Left knee x-ray is negative for bony abnormality including fracture or dislocation.  I have reviewed the x-ray myself and the radiologist interpretation.  I am in agreement with the radiologist interpretation.  ASSESSMENT & PLAN:  1. Acute pain of left knee     Meds ordered this encounter  Medications  . ibuprofen (ADVIL) 800 MG tablet    Sig: Take 1 tablet (800 mg total) by mouth 3 (three) times daily.    Dispense:  21 tablet    Refill:  0    Discharge instruction  Take OTC Tylenol/ibuprofen as needed for pain Follow RICE instruction that is attached Follow-up with PCP May use knee  sleeve/can be found at [facility], [facility] or [facility] Return or go to ED for worsening of symptoms  Reviewed expectations re: course of current medical issues. Questions answered. Outlined signs and symptoms indicating need for more acute intervention. Patient verbalized understanding. After Visit Summary given.      Note: This document was prepared using 03/09/2020 and may  include unintentional dictation errors.    Durward Parcel, FNP 01/08/20 1156

## 2020-01-08 NOTE — Discharge Instructions (Addendum)
Take OTC Tylenol/ibuprofen as needed for pain Follow RICE instruction that is attached Follow-up with PCP May use knee sleeve/can be found at [facility], [facility] or [facility] Return or go to ED for worsening of symptoms

## 2020-01-08 NOTE — ED Triage Notes (Signed)
LT knee pain since falling and landed on her knee yesterday

## 2020-02-05 ENCOUNTER — Other Ambulatory Visit: Payer: Self-pay | Admitting: Adult Health

## 2020-02-05 MED ORDER — MEGESTROL ACETATE 40 MG PO TABS: ORAL_TABLET | ORAL | 0 refills | Status: DC

## 2020-02-05 NOTE — Progress Notes (Signed)
Refill megace  

## 2020-03-13 ENCOUNTER — Other Ambulatory Visit: Payer: Self-pay

## 2020-03-14 MED ORDER — MEGESTROL ACETATE 40 MG PO TABS: ORAL_TABLET | ORAL | 0 refills | Status: DC

## 2020-03-22 ENCOUNTER — Other Ambulatory Visit: Payer: Self-pay

## 2020-03-22 MED ORDER — ESTRADIOL 1 MG PO TABS: 1.0000 mg | ORAL_TABLET | Freq: Every day | ORAL | 0 refills | Status: DC

## 2020-04-24 ENCOUNTER — Other Ambulatory Visit: Payer: Self-pay

## 2020-04-26 ENCOUNTER — Ambulatory Visit: Payer: Medicaid Other | Admitting: Women's Health

## 2020-04-26 MED ORDER — MEGESTROL ACETATE 40 MG PO TABS: ORAL_TABLET | ORAL | 0 refills | Status: DC

## 2020-05-05 ENCOUNTER — Ambulatory Visit: Payer: Medicaid Other | Admitting: Obstetrics and Gynecology

## 2020-07-07 ENCOUNTER — Other Ambulatory Visit: Payer: Self-pay

## 2020-08-15 ENCOUNTER — Other Ambulatory Visit: Payer: Self-pay

## 2020-08-15 ENCOUNTER — Other Ambulatory Visit (HOSPITAL_COMMUNITY)
Admission: RE | Admit: 2020-08-15 | Discharge: 2020-08-15 | Disposition: A | Discharge status: Home / Self Care | Payer: Medicaid Other | Source: Ambulatory Visit | Attending: Obstetrics & Gynecology | Admitting: Obstetrics & Gynecology

## 2020-08-15 ENCOUNTER — Encounter: Payer: Self-pay | Admitting: Women's Health

## 2020-08-15 ENCOUNTER — Ambulatory Visit (INDEPENDENT_AMBULATORY_CARE_PROVIDER_SITE_OTHER): Payer: Medicaid Other | Admitting: Women's Health

## 2020-08-15 VITALS — BP 129/84 | HR 107 | Ht 66.0 in | Wt 250.0 lb

## 2020-08-15 DIAGNOSIS — Z113 Encounter for screening for infections with a predominantly sexual mode of transmission: Secondary | ICD-10-CM

## 2020-08-15 DIAGNOSIS — Z0000 Encounter for general adult medical examination without abnormal findings: Secondary | ICD-10-CM

## 2020-08-15 DIAGNOSIS — N926 Irregular menstruation, unspecified: Secondary | ICD-10-CM | POA: Diagnosis not present

## 2020-08-15 DIAGNOSIS — Z3009 Encounter for other general counseling and advice on contraception: Secondary | ICD-10-CM | POA: Insufficient documentation

## 2020-08-15 NOTE — Progress Notes (Signed)
WELL-WOMAN EXAMINATION Patient name: Deanna Harper MRN [id number]  Date of birth: Apr 28, 2001 Chief Complaint:   Gynecologic Exam  History of Present Illness:   Deanna Harper is a 20 y.o. G0P0000 Hispanic female being seen today for a routine [facility] well-woman exam.  Current complaints: prolonged periods. Mirena IUD placed 12/17/18 for same reason, but hasn't helped at all. Either spots or has heavy bleeding majority of time, very rare she is not bleeding. Tried megace, but made her gain weight and lose hair and she did not like it. Is really getting frustrated. Denies abnormal discharge, itching/odor/irritation.   Depression screen [facility] 2/9 08/15/2020 07/15/2019  Decreased Interest 0 0  Down, Depressed, Hopeless 0 0  PHQ - 2 Score 0 0  Altered sleeping 1 -  Tired, decreased energy 0 -  Change in appetite 0 -  Feeling bad or failure about yourself  0 -  Trouble concentrating 1 -  Moving slowly or fidgety/restless 0 -  Suicidal thoughts 0 -  PHQ-9 Score 2 -     PCP: [place]      Patient's last menstrual period was 07/15/2020. The current method of family planning is IUD. Mirena inserted 12/17/18 Last pap 07/15/19. Results were: NILM w/ HRHPV not done. H/O abnormal pap: no Last mammogram: never. Results were: N/A. Family h/o breast cancer: yes MGGM and MGGA Last colonoscopy: never. Results were: N/A. Family h/o colorectal cancer: no Review of Systems:   Pertinent items are noted in HPI Denies any headaches, blurred vision, fatigue, shortness of breath, chest pain, abdominal pain, abnormal vaginal discharge/itching/odor/irritation, problems with periods, bowel movements, urination, or intercourse unless otherwise stated above. Pertinent History Reviewed:  Reviewed past medical,surgical, social and family history.  Reviewed problem list, medications and allergies. Physical Assessment:   Vitals:   08/15/20 0836  BP: 129/84  Pulse: (!) 107  Weight: 250 lb (113.4 kg)  Height: 5'  6" (1.676 m)  Body mass index is 40.35 kg/m.        Physical Examination:   General appearance - well appearing, and in no distress  Mental status - alert, oriented to person, place, and time  Psych:  She has a normal mood and affect  Skin - warm and dry, normal color, no suspicious lesions noted  Chest - effort normal, all lung fields clear to auscultation bilaterally  Heart - normal rate and regular rhythm  Neck:  midline trachea, no thyromegaly or nodules  Breasts - breasts appear normal, no suspicious masses, no skin or nipple changes or  axillary nodes  Abdomen - soft, nontender, nondistended, no masses or organomegaly  Pelvic - VULVA: normal appearing vulva with no masses, tenderness or lesions  VAGINA: normal appearing vagina with normal color and discharge, no lesions  CERVIX: normal appearing cervix without discharge or lesions, no CMT, IUD strings visible and appropriate length  Thin prep pap is not done  UTERUS: uterus is felt to be normal size, shape, consistency and nontender   ADNEXA: No adnexal masses or tenderness noted.  Extremities:  No swelling or varicosities noted  Chaperone: Faith Rogue    No results found for this or any previous visit (from the past 24 hour(s)).  Assessment & Plan:  1) [facility] Well-Woman Exam  2) Prolonged periods> reason she got Mirena Aug 2020 and has not helped. CV swab today (understands [facility] may not cover bv/yeast screening). If neg, will trial adding estradiol +/- u/s (understands [facility] won't cover u/s)  Labs/procedures  today: HIV, RPR, CV swab  Mammogram: @ 22yo, or sooner if problems Colonoscopy: @ 20yo, or sooner if problems  Orders Placed This Encounter  Procedures  . HIV Antibody (routine testing w rflx)  . RPR    Meds: No orders of the defined types were placed in this encounter.   Follow-up: Return in about 1 year (around 08/15/2021) for Pap & physical.  Cheral Marker CNM, [facility] 08/15/2020 9:09 AM

## 2020-08-16 LAB — RPR: RPR Ser Ql: NONREACTIVE

## 2020-08-16 LAB — CERVICOVAGINAL ANCILLARY ONLY
Bacterial Vaginitis (gardnerella): NEGATIVE
Candida Glabrata: NEGATIVE
Candida Vaginitis: NEGATIVE
Chlamydia: NEGATIVE
Comment: NEGATIVE
Comment: NEGATIVE
Comment: NEGATIVE
Comment: NEGATIVE
Comment: NEGATIVE
Comment: NORMAL
Neisseria Gonorrhea: NEGATIVE
Trichomonas: NEGATIVE

## 2020-08-16 LAB — HIV ANTIBODY (ROUTINE TESTING W REFLEX): HIV Screen 4th Generation wRfx: NONREACTIVE

## 2020-08-17 ENCOUNTER — Telehealth: Payer: Self-pay | Admitting: Women's Health

## 2020-08-17 NOTE — Telephone Encounter (Signed)
Pt called back, appointments scheduled

## 2020-08-17 NOTE — Telephone Encounter (Signed)
[facility]-need to schedule pelvic U/S & see K.Booker after

## 2020-08-22 ENCOUNTER — Other Ambulatory Visit: Payer: Self-pay | Admitting: Women's Health

## 2020-08-22 MED ORDER — NAPROXEN 500 MG PO TABS: 500.0000 mg | ORAL_TABLET | Freq: Two times a day (BID) | ORAL | 11 refills | Status: DC

## 2020-08-29 ENCOUNTER — Other Ambulatory Visit: Payer: Self-pay | Admitting: Women's Health

## 2020-08-29 MED ORDER — TRANEXAMIC ACID 650 MG PO TABS: 650.0000 mg | ORAL_TABLET | Freq: Three times a day (TID) | ORAL | 0 refills | Status: DC

## 2020-08-29 MED ORDER — MYFEMBREE 40-1-0.5 MG PO TABS: 1.0000 | ORAL_TABLET | Freq: Every day | ORAL | 11 refills | Status: DC

## 2020-09-21 ENCOUNTER — Other Ambulatory Visit: Payer: Medicaid Other

## 2020-09-21 ENCOUNTER — Ambulatory Visit: Payer: Medicaid Other | Admitting: Women's Health

## 2020-09-21 ENCOUNTER — Other Ambulatory Visit: Payer: Self-pay | Admitting: Women's Health

## 2020-09-21 DIAGNOSIS — N921 Excessive and frequent menstruation with irregular cycle: Secondary | ICD-10-CM

## 2020-09-22 ENCOUNTER — Other Ambulatory Visit: Payer: Medicaid Other

## 2020-09-22 ENCOUNTER — Ambulatory Visit: Payer: Medicaid Other | Admitting: Women's Health

## 2020-10-21 ENCOUNTER — Other Ambulatory Visit: Payer: Medicaid Other

## 2020-10-21 ENCOUNTER — Ambulatory Visit: Payer: Medicaid Other | Admitting: Women's Health

## 2020-11-04 ENCOUNTER — Ambulatory Visit (INDEPENDENT_AMBULATORY_CARE_PROVIDER_SITE_OTHER): Payer: BC Managed Care – PPO | Admitting: Orthopedic Surgery

## 2020-11-04 ENCOUNTER — Ambulatory Visit: Payer: BC Managed Care – PPO

## 2020-11-04 ENCOUNTER — Other Ambulatory Visit: Payer: Self-pay

## 2020-11-04 ENCOUNTER — Encounter: Payer: Self-pay | Admitting: Orthopedic Surgery

## 2020-11-04 VITALS — BP 141/88 | HR 84 | Ht 66.0 in | Wt 245.0 lb

## 2020-11-04 DIAGNOSIS — M25511 Pain in right shoulder: Secondary | ICD-10-CM

## 2020-11-04 DIAGNOSIS — G8929 Other chronic pain: Secondary | ICD-10-CM

## 2020-11-04 DIAGNOSIS — M25512 Pain in left shoulder: Secondary | ICD-10-CM

## 2020-11-04 NOTE — Progress Notes (Signed)
New Patient Visit  Assessment: Deanna Harper is a 20 y.o. female with the following: Bilateral shoulder pain, atraumatic; primarily muscular pain  Plan: Radiographs obtained in clinic today do not demonstrate an abnormality.  The description of her pain is most consistent with muscular pain.  Pain could be related to her posture positioning while at work.  She can take medications as needed.  I also discussed the possibility of proceeding with formal physical therapy, I do think that this will be beneficial for her.  We will place referral today.  Her pain is likely related to there are some muscular imbalances, or fatigue within the postural muscles in the upper back and neck area.  If she has any issues in the future, she can return for repeat evaluation.  Follow-up: Return if symptoms worsen or fail to improve.  Subjective:  Chief Complaint  Patient presents with   Shoulder Pain    LT>RT for 8+ years. 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    History of Present Illness: Deanna Harper is a 20 y.o. female who presents to clinic today for evaluation of bilateral shoulder pain.  She has had pain in both shoulders for at least 8 years.  Pain is worse on the left compared to right.  Pain is primarily within the superior aspect of the shoulder, with some radiation into her neck.  Pain gets worse throughout the day.  Takes occasional Tylenol or ibuprofen for pain.  She denies a specific injury.  She is currently working a left, using the microscope for many hours per day.  She has previously seen Dr. Romeo Apple for her pain, with little improvement in her symptoms.  No previous treatment medications as needed.  No previous injections.  She has not worked with physical therapy.   Review of Systems: No fevers or chills No numbness or tingling No chest pain No shortness of breath No bowel or bladder dysfunction No GI distress No headaches   Medical  History:  No past medical history on file.  Past Surgical History:  Procedure Laterality Date   NO PAST SURGERIES      Family History  Problem Relation Age of Onset   Allergic rhinitis Sister    Asthma Sister    Angioedema Neg Hx    Eczema Neg Hx    Immunodeficiency Neg Hx    Urticaria Neg Hx    Social History   Tobacco Use   Smoking status: Never   Smokeless tobacco: Never  Vaping Use   Vaping Use: Never used  Substance Use Topics   Alcohol use: No    Alcohol/week: 0.0 standard drinks   Drug use: No    No Known Allergies  No outpatient medications have been marked as taking for the 11/04/20 encounter (Office Visit) with Oliver Barre, MD.    Objective: BP (!) 141/88   Pulse 84   Ht 5\' 6"  (1.676 m)   Wt 245 lb (111.1 kg)   BMI 39.54 kg/m   Physical Exam:  General: Alert and oriented. and No acute distress. Gait: Normal gait.  Evaluation of bilateral shoulders demonstrates normal muscle bulk.  She has full and painless range of motion in her right shoulder, slightly restricted external rotation in the abducted position on the left..  Strength is 5/5.  Provocative maneuvers are negative.  She does have some tenderness to palpation within the trapezius muscle on the left.  The extremes  of motion tend to exacerbate this discomfort.  Sensation is intact distally.  Fingers are warm and well-perfused.    IMAGING: I personally ordered and reviewed the following images  X-rays of bilateral shoulders were obtained in clinic today and demonstrates no acute injuries.  Glenohumeral joints are reduced.  There is no proximal humeral migration.  No AC joint arthritis.  Impression: Normal bilateral shoulder x-rays   New Medications:  No orders of the defined types were placed in this encounter.     Oliver Barre, MD  11/04/2020 12:45 PM

## 2020-11-08 ENCOUNTER — Other Ambulatory Visit: Payer: Self-pay | Admitting: Women's Health

## 2020-11-08 MED ORDER — ORILISSA 150 MG PO TABS: 1.0000 | ORAL_TABLET | Freq: Every day | ORAL | 11 refills | Status: DC

## 2020-11-10 ENCOUNTER — Telehealth: Payer: Self-pay | Admitting: Adult Health

## 2020-11-10 NOTE — Telephone Encounter (Signed)
[facility] called to check status of prior auth for pt's Dewayne Hatch  Please advise & call [phone], ref# [id number]

## 2020-11-11 NOTE — Telephone Encounter (Signed)
PA [id number]

## 2020-11-11 NOTE — Telephone Encounter (Signed)
[facility] called, new PA request initiated and completed. Awaiting decision.

## 2020-11-15 ENCOUNTER — Telehealth: Payer: Self-pay

## 2020-11-15 NOTE — Telephone Encounter (Signed)
[facility] called to check on PA. PA was completed on 7/15. [facility] will wait 2 days and follow-up with insurance company to see progress. Will call office if claim is denied.

## 2020-11-18 ENCOUNTER — Other Ambulatory Visit: Payer: Self-pay

## 2020-11-18 ENCOUNTER — Ambulatory Visit (INDEPENDENT_AMBULATORY_CARE_PROVIDER_SITE_OTHER): Payer: BC Managed Care – PPO | Admitting: Women's Health

## 2020-11-18 ENCOUNTER — Ambulatory Visit (INDEPENDENT_AMBULATORY_CARE_PROVIDER_SITE_OTHER): Payer: BC Managed Care – PPO

## 2020-11-18 ENCOUNTER — Encounter: Payer: Self-pay | Admitting: Women's Health

## 2020-11-18 VITALS — BP 107/79 | HR 76 | Ht 66.0 in | Wt 244.4 lb

## 2020-11-18 DIAGNOSIS — M25512 Pain in left shoulder: Secondary | ICD-10-CM | POA: Diagnosis not present

## 2020-11-18 DIAGNOSIS — N920 Excessive and frequent menstruation with regular cycle: Secondary | ICD-10-CM | POA: Diagnosis not present

## 2020-11-18 DIAGNOSIS — M791 Myalgia, unspecified site: Secondary | ICD-10-CM | POA: Diagnosis not present

## 2020-11-18 DIAGNOSIS — Z30432 Encounter for removal of intrauterine contraceptive device: Secondary | ICD-10-CM | POA: Diagnosis not present

## 2020-11-18 DIAGNOSIS — T8332XA Displacement of intrauterine contraceptive device, initial encounter: Secondary | ICD-10-CM

## 2020-11-18 DIAGNOSIS — M542 Cervicalgia: Secondary | ICD-10-CM | POA: Diagnosis not present

## 2020-11-18 DIAGNOSIS — N921 Excessive and frequent menstruation with irregular cycle: Secondary | ICD-10-CM

## 2020-11-18 DIAGNOSIS — Z30430 Encounter for insertion of intrauterine contraceptive device: Secondary | ICD-10-CM

## 2020-11-18 DIAGNOSIS — M25511 Pain in right shoulder: Secondary | ICD-10-CM | POA: Diagnosis not present

## 2020-11-18 MED ORDER — LEVONORGESTREL 20 MCG/DAY IU IUD
1.0000 | INTRAUTERINE_SYSTEM | Freq: Once | INTRAUTERINE | Status: AC
  Administered 2020-11-18: 1 via INTRAUTERINE

## 2020-11-18 NOTE — Progress Notes (Signed)
PELVIC US TA/TV: homogeneous axial positioned uterus,wnl EEC 4.5 mm, IUD appears to be imbedded in the myometrium in the lower uterine segment ,limited view of IUD because of the position of the uterus,no free fluid,normal ovaries,no pain during ultrasound,ovaries appear mobile, Joellyn Haff reviewed images during ultrasound  [employer]

## 2020-11-18 NOTE — Progress Notes (Signed)
IUD REMOVAL & RE-INSERTION Patient name: SAMONA CHIHUAHUA MRN [id number]  Date of birth: 02-Apr-2001 Subjective Findings:   @[place]  V Bischof is a 20 y.o. G0P0000 female being seen today for removal of a Mirena  IUD and insertion of a Mirena  IUD. Her IUD was placed 12/17/18, and on u/s today appears to be malpositioned, possibly in myometrium.  She originally got the IUD for management of heavy prolonged periods, never helped. We started 12/19/18 back in May, which has helped tremendously, but insurance coverage is still pending, so has been using samples only.   No LMP recorded. (Menstrual status: IUD). Last pap3/17/21. Results were: NILM w/ HRHPV not done  The risks and benefits of the method and placement have been thouroughly reviewed with the patient and all questions were answered.  Specifically the patient is aware of failure rate of 04/998, expulsion of the IUD and of possible perforation.  The patient is aware of irregular bleeding due to the method and understands the incidence of irregular bleeding diminishes with time.  Signed copy of informed consent in chart.   Depression screen [facility] 2/9 08/15/2020 07/15/2019  Decreased Interest 0 0  Down, Depressed, Hopeless 0 0  PHQ - 2 Score 0 0  Altered sleeping 1 -  Tired, decreased energy 0 -  Change in appetite 0 -  Feeling bad or failure about yourself  0 -  Trouble concentrating 1 -  Moving slowly or fidgety/restless 0 -  Suicidal thoughts 0 -  PHQ-9 Score 2 -     GAD 7 : Generalized Anxiety Score 08/15/2020  Nervous, Anxious, on Edge 0  Control/stop worrying 0  Worry too much - different things 1  Trouble relaxing 0  Restless 0  Easily annoyed or irritable 0  Afraid - awful might happen 0  Total GAD 7 Score 1     Pertinent History Reviewed:   Reviewed past medical,surgical, social, obstetrical and family history.  Reviewed problem list, medications and allergies. Objective Findings & Procedure:    Vitals:   11/18/20 0912   BP: 107/79  Pulse: 76  Weight: 244 lb 6.4 oz (110.9 kg)  Height: 5\' 6"  (1.676 m)  Body mass index is 39.45 kg/m.  No results found for this or any previous visit (from the past 24 hour(s)).  Today's pelvic u/s: DERRIAN RODAK is a 20 y.o. G0P0000 No LMP recorded. (Menstrual status: IUD). She is here for a pelvic sonogram for menometrorrhagia. Uterus                      7.2 x 4.4 x 5.8 cm, Total uterine volume 97 cc, homogeneous axial positioned uterus,wnl Endometrium          4.5 mm, symmetrical, IUD appears to be imbedded in the myometrium in the lower uterine segment Right ovary             2.4 x 2.1 x 3 cm, wnl Left ovary                2.8 x 2.2 x 3 cm, wnl No free fluid   Technician Comments: PELVIC Danella Deis TA/TV: homogeneous axial positioned uterus,wnl EEC 4.5 mm, IUD appears to be imbedded in the myometrium in the lower uterine segment ,limited view of IUD because of the position of the uterus,no free fluid,normal ovaries,no pain during ultrasound,ovaries appear mobile, 21 reviewed images during ultrasound Chaperone Peggy   [place] 11/18/2020 12:08 PM  IUD Removal/Reinsertion Time  out was performed.  A graves speculum was placed in the vagina.  The cervix was visualized, prepped using Betadine. The strings were visible. They were grasped and the Mirena IUD was removed w/ 2 gentle pulls (did feel it was tugging slighty c/w being partly in myometrium). The cervix was then grasped with a single-tooth tenaculum. The uterus was found to be neutral and it sounded to 8 cm.  Mirena  IUD placed per manufacturer's recommendations without complications. The strings were trimmed to approximately 3 cm.  The patient tolerated the procedure well.   Informal transvaginal sonogram was performed and the proper placement of the IUD was verified.   Chaperone: Field seismologist & Plan:   1) Mirena IUD removal & Mirena insertion d/t malpositioned IUD The patient was given post  procedure instructions, including signs and symptoms of infection and to check for the strings after each menses or each month, and refraining from intercourse or anything in the vagina for 3 days. She was given a care card with date IUD placed, and date IUD to be removed. She is scheduled for a f/u appointment in 4 weeks.  2) Heavy prolonged periods> much improved w/ Dewayne Hatch, PA pending. IUD may not have been working properly for this indication d/t partly being in myometrium. Now that replaced w/in endometrium, the IUD may help w/ this and she may not need the [place]. Has currently been out of it for a week (ran out of samples and we do not have anymore in office today). To let us know how bleeding does.  No orders of the defined types were placed in this encounter.   Follow-up: Return in about 4 weeks (around 12/16/2020) for IUD f/u, CNM, in person.  Cheral Marker CNM, [facility] 11/18/2020 10:13 AM

## 2020-11-18 NOTE — Patient Instructions (Signed)
Nothing in vagina for 3 days (no sex, douching, tampons, etc...) Check your strings once a month to make sure you can feel them, if you are not able to please let us know If you develop a fever of 100.4 or more in the next few weeks, or if you develop severe abdominal pain, please let us know Use a backup method of birth control, such as condoms, for 2 weeks  Intrauterine Device Insertion, Care After This sheet gives you information about how to care for yourself after your procedure. Your health care provider may also give you more specific instructions. If you have problems or questions, contact your health care provider. What can I expect after the procedure? After the procedure, it is common to have: Cramps and pain in the abdomen. Bleeding. It may be light or heavy. This may last for a few days. Lower back pain. Dizziness. Headaches. Nausea. Follow these instructions at home:  Before resuming sexual activity, check to make sure that you can feel the IUD string or strings. You should be able to feel the end of the string below the opening of your cervix. If your IUD string is in place, you may resume sexual activity. If you had a hormonal IUD inserted more than 7 days after your most recent period started, you will need to use a backup method of birth control for 7 days after IUD insertion. Ask your health care provider whether this applies to you. Continue to check that the IUD is still in place by feeling for the strings after every menstrual period, or once a month. An IUD will not protect you from sexually transmitted infections (STIs). Use methods to prevent the exchange of body fluids between partners (barrier protection) every time you have sex. Barrier protection can be used during oral, vaginal, or anal sex. Commonly used barrier methods include: Female condom. Female condom. Dental dam. Take over-the-counter and prescription medicines only as told by your health care  provider. Keep all follow-up visits as told by your health care provider. This is important. Contact a health care provider if: You feel light-headed or weak. You have any of the following problems with your IUD string or strings: The string bothers or hurts you or your sexual partner. You cannot feel the string. The string has gotten longer. You can feel the IUD in your vagina. You think you may be pregnant, or you miss your menstrual period. You think you may have a sexually transmitted infection (STI). Get help right away if: You have flu-like symptoms, such as tiredness (fatigue) and muscle aches. You have a fever and chills. You have bleeding that is heavier or lasts longer than a normal menstrual cycle. You have abnormal or bad-smelling discharge from your vagina. You develop abdominal pain that is new, is getting worse, or is not in the same area of earlier cramping and pain. You have pain during sexual activity. Summary After the procedure, it is common to have cramps and pain in the abdomen. It is also common to have light bleeding or heavier bleeding that is like your menstrual period. Continue to check that the IUD is still in place by feeling for the strings after every menstrual period, or once a month. Keep all follow-up visits as told by your health care provider. This is important. Contact your health care provider if you have problems with your IUD strings, such as the string getting longer or bothering you or your sexual partner. This information is not intended   to replace advice given to you by your health care provider. Make sure you discuss any questions you have with your health care provider. Document Revised: 04/07/2019 Document Reviewed: 04/07/2019 Elsevier Patient Education  2022 [employer].  

## 2020-11-21 ENCOUNTER — Telehealth: Payer: Self-pay | Admitting: Women's Health

## 2020-11-21 NOTE — Telephone Encounter (Signed)
[name] w/[facility] called to say that pt's insurance denied the Dewayne Hatch  They wanted to know if we were going to appeal or change meds  Please advise

## 2020-11-25 DIAGNOSIS — M542 Cervicalgia: Secondary | ICD-10-CM | POA: Diagnosis not present

## 2020-11-25 DIAGNOSIS — M25512 Pain in left shoulder: Secondary | ICD-10-CM | POA: Diagnosis not present

## 2020-11-25 DIAGNOSIS — M791 Myalgia, unspecified site: Secondary | ICD-10-CM | POA: Diagnosis not present

## 2020-11-25 DIAGNOSIS — M25511 Pain in right shoulder: Secondary | ICD-10-CM | POA: Diagnosis not present

## 2020-11-28 DIAGNOSIS — M791 Myalgia, unspecified site: Secondary | ICD-10-CM | POA: Diagnosis not present

## 2020-11-28 DIAGNOSIS — M25511 Pain in right shoulder: Secondary | ICD-10-CM | POA: Diagnosis not present

## 2020-11-28 DIAGNOSIS — M542 Cervicalgia: Secondary | ICD-10-CM | POA: Diagnosis not present

## 2020-11-28 DIAGNOSIS — M25512 Pain in left shoulder: Secondary | ICD-10-CM | POA: Diagnosis not present

## 2020-11-30 DIAGNOSIS — M25511 Pain in right shoulder: Secondary | ICD-10-CM | POA: Diagnosis not present

## 2020-11-30 DIAGNOSIS — M791 Myalgia, unspecified site: Secondary | ICD-10-CM | POA: Diagnosis not present

## 2020-11-30 DIAGNOSIS — M542 Cervicalgia: Secondary | ICD-10-CM | POA: Diagnosis not present

## 2020-11-30 DIAGNOSIS — M25512 Pain in left shoulder: Secondary | ICD-10-CM | POA: Diagnosis not present

## 2020-12-07 DIAGNOSIS — M542 Cervicalgia: Secondary | ICD-10-CM | POA: Diagnosis not present

## 2020-12-07 DIAGNOSIS — M25511 Pain in right shoulder: Secondary | ICD-10-CM | POA: Diagnosis not present

## 2020-12-07 DIAGNOSIS — M25512 Pain in left shoulder: Secondary | ICD-10-CM | POA: Diagnosis not present

## 2020-12-07 DIAGNOSIS — M791 Myalgia, unspecified site: Secondary | ICD-10-CM | POA: Diagnosis not present

## 2020-12-13 DIAGNOSIS — M542 Cervicalgia: Secondary | ICD-10-CM | POA: Diagnosis not present

## 2020-12-13 DIAGNOSIS — M25511 Pain in right shoulder: Secondary | ICD-10-CM | POA: Diagnosis not present

## 2020-12-13 DIAGNOSIS — M25512 Pain in left shoulder: Secondary | ICD-10-CM | POA: Diagnosis not present

## 2020-12-13 DIAGNOSIS — M791 Myalgia, unspecified site: Secondary | ICD-10-CM | POA: Diagnosis not present

## 2020-12-15 DIAGNOSIS — M25511 Pain in right shoulder: Secondary | ICD-10-CM | POA: Diagnosis not present

## 2020-12-15 DIAGNOSIS — M791 Myalgia, unspecified site: Secondary | ICD-10-CM | POA: Diagnosis not present

## 2020-12-15 DIAGNOSIS — M25512 Pain in left shoulder: Secondary | ICD-10-CM | POA: Diagnosis not present

## 2020-12-15 DIAGNOSIS — M542 Cervicalgia: Secondary | ICD-10-CM | POA: Diagnosis not present

## 2020-12-16 ENCOUNTER — Ambulatory Visit (INDEPENDENT_AMBULATORY_CARE_PROVIDER_SITE_OTHER): Payer: BC Managed Care – PPO | Admitting: Women's Health

## 2020-12-16 ENCOUNTER — Other Ambulatory Visit: Payer: Self-pay

## 2020-12-16 ENCOUNTER — Encounter: Payer: Self-pay | Admitting: Women's Health

## 2020-12-16 VITALS — BP 119/79 | HR 82 | Ht 66.0 in | Wt 244.0 lb

## 2020-12-16 DIAGNOSIS — Z30431 Encounter for routine checking of intrauterine contraceptive device: Secondary | ICD-10-CM

## 2020-12-16 NOTE — Progress Notes (Signed)
   GYN VISIT Patient name: Deanna Harper MRN [id number]  Date of birth: Mar 07, 2001 Chief Complaint:   IUD Check  History of Present Illness:   Deanna Harper is a 20 y.o. G0P0000 Hispanic female being seen today for IUD check.  Mirena inserted 11/18/20.  Bleeding x 11d, mild cramping. No pain. Hasn't felt for strings.  Patient's last menstrual period was 12/05/2020. The current method of family planning is IUD.  Last pap 07/15/19. Results were: NILM w/ HRHPV not done  Depression screen [facility] 2/9 08/15/2020 07/15/2019  Decreased Interest 0 0  Down, Depressed, Hopeless 0 0  PHQ - 2 Score 0 0  Altered sleeping 1 -  Tired, decreased energy 0 -  Change in appetite 0 -  Feeling bad or failure about yourself  0 -  Trouble concentrating 1 -  Moving slowly or fidgety/restless 0 -  Suicidal thoughts 0 -  PHQ-9 Score 2 -     GAD 7 : Generalized Anxiety Score 08/15/2020  Nervous, Anxious, on Edge 0  Control/stop worrying 0  Worry too much - different things 1  Trouble relaxing 0  Restless 0  Easily annoyed or irritable 0  Afraid - awful might happen 0  Total GAD 7 Score 1     Review of Systems:   Pertinent items are noted in HPI Denies fever/chills, dizziness, headaches, visual disturbances, fatigue, shortness of breath, chest pain, abdominal pain, vomiting, abnormal vaginal discharge/itching/odor/irritation, problems with periods, bowel movements, urination, or intercourse unless otherwise stated above.  Pertinent History Reviewed:  Reviewed past medical,surgical, social, obstetrical and family history.  Reviewed problem list, medications and allergies. Physical Assessment:   Vitals:   12/16/20 1004  BP: 119/79  Pulse: 82  Weight: 244 lb (110.7 kg)  Height: 5\' 6"  (1.676 m)  Body mass index is 39.38 kg/m.       Physical Examination:   General appearance: alert, well appearing, and in no distress  Mental status: alert, oriented to person, place, and time  Skin: warm & dry    Cardiovascular: normal heart rate noted  Respiratory: normal respiratory effort, no distress  Abdomen: soft, non-tender   Pelvic: VULVA: normal appearing vulva with no masses, tenderness or lesions, VAGINA: normal appearing vagina with normal color and discharge, no lesions, CERVIX: normal appearing cervix without discharge or lesions, IUD strings visible and appropriate length  Extremities: no edema   Chaperone:    Informal TV u/s: IUD in correct placement  No results found for this or any previous visit (from the past 24 hour(s)).  Assessment & Plan:  1) IUD check> in place, bleeding x 11d- if still bleeding in 2wks let me know and will rx megace  Meds: No orders of the defined types were placed in this encounter.   No orders of the defined types were placed in this encounter.   Return in about 1 year (around 12/16/2021) for Physical.  12/18/2021 CNM, [facility] 12/16/2020 10:46 AM

## 2020-12-19 ENCOUNTER — Ambulatory Visit
Admission: EM | Admit: 2020-12-19 | Discharge: 2020-12-19 | Disposition: A | Discharge status: Home / Self Care | Payer: BC Managed Care – PPO | Attending: Family Medicine | Admitting: Family Medicine

## 2020-12-19 DIAGNOSIS — H9202 Otalgia, left ear: Secondary | ICD-10-CM | POA: Diagnosis not present

## 2020-12-19 DIAGNOSIS — J039 Acute tonsillitis, unspecified: Secondary | ICD-10-CM

## 2020-12-19 LAB — POCT RAPID STREP A (OFFICE): Rapid Strep A Screen: NEGATIVE

## 2020-12-19 MED ORDER — PREDNISONE 20 MG PO TABS: 20.0000 mg | ORAL_TABLET | Freq: Every day | ORAL | 0 refills | Status: AC

## 2020-12-19 MED ORDER — AZITHROMYCIN 250 MG PO TABS: ORAL_TABLET | ORAL | 0 refills | Status: DC

## 2020-12-19 NOTE — ED Triage Notes (Signed)
Pt presents with c/o sore throat and ear ache that began last night , pt reports white patches

## 2020-12-19 NOTE — ED Provider Notes (Signed)
[facility]    CSN: [id number] Arrival date & time: 12/19/20  1438      History   Chief Complaint Chief Complaint  Patient presents with   Sore Throat    HPI Deanna Harper is a 20 y.o. female.   HPI Patient presents today with soreness of throat, pain with swallowing, mild nasal congestion, left ear pain and pressure. Afebrile. No nausea or vomiting. Symptoms present x 24 hours.  No past medical history on file.  Patient Active Problem List   Diagnosis Date Noted   Weight gain due to medication 10/06/2019   Prolonged periods 08/25/2019   Dysmenorrhea 06/16/2019   Menorrhagia with regular cycle 06/16/2019   Encounter for IUD insertion 12/17/2018    Past Surgical History:  Procedure Laterality Date   NO PAST SURGERIES      OB History     Gravida  0   Para  0   Term  0   Preterm  0   AB  0   Living  0      SAB  0   IAB  0   Ectopic  0   Multiple  0   Live Births  0            Home Medications    Prior to Admission medications   Medication Sig Start Date End Date Taking? Authorizing Provider  azithromycin (ZITHROMAX) 250 MG tablet Take 2 tabs PO x 1 dose, then 1 tab PO QD x 4 days 12/19/20  Yes Bing Neighbors, FNP  naproxen (NAPROSYN) 500 MG tablet Take 1 tablet (500 mg total) by mouth 2 (two) times daily with a meal. X 5 days Patient not taking: No sig reported 08/22/20   Cheral Marker, CNM  predniSONE (DELTASONE) 20 MG tablet Take 1 tablet (20 mg total) by mouth daily with breakfast for 5 days. 12/19/20 12/24/20 Yes Bing Neighbors, FNP  Elagolix Sodium (ORILISSA) 150 MG TABS Take 1 tablet by mouth daily. Patient not taking: Reported on 12/16/2020 11/08/20   Cheral Marker, CNM  ibuprofen (ADVIL) 800 MG tablet Take 1 tablet (800 mg total) by mouth 3 (three) times daily. Patient not taking: No sig reported 01/08/20   Durward Parcel, FNP  levonorgestrel (MIRENA) 20 MCG/24HR IUD 1 each by Intrauterine route once.     [provider]  SUMAtriptan (IMITREX) 50 MG tablet     [provider]    Family History Family History  Problem Relation Age of Onset   Allergic rhinitis Sister    Asthma Sister    Angioedema Neg Hx    Eczema Neg Hx    Immunodeficiency Neg Hx    Urticaria Neg Hx     Social History Social History   Tobacco Use   Smoking status: Never   Smokeless tobacco: Never  Vaping Use   Vaping Use: Never used  Substance Use Topics   Alcohol use: No    Alcohol/week: 0.0 standard drinks   Drug use: No     Allergies   Amoxicillin   Review of Systems Review of Systems Pertinent negatives listed in HPI   Physical Exam Triage Vital Signs ED Triage Vitals  Enc Vitals Group     BP 12/19/20 1551 124/81     Pulse Rate 12/19/20 1551 96     Resp 12/19/20 1551 18     Temp 12/19/20 1551 98.4 F (36.9 C)     Temp src --  SpO2 12/19/20 1551 97 %     Weight --      Height --      Head Circumference --      Peak Flow --      Pain Score 12/19/20 1550 3     Pain Loc --      Pain Edu? --      Excl. in GC? --    No data found.  Updated Vital Signs BP 124/81   Pulse 96   Temp 98.4 F (36.9 C)   Resp 18   LMP 12/05/2020   SpO2 97%   Visual Acuity Right Eye Distance:   Left Eye Distance:   Bilateral Distance:    Right Eye Near:   Left Eye Near:    Bilateral Near:     Physical Exam General Appearance:    Alert, cooperative, no distress  HENT:  Normocephalic, ear normal, neck has bilateral anterior cervical nodes enlarged and tonsils red, enlarged, with exudate present  Eyes:    PERRL, conjunctiva/corneas clear, EOM's intact       Lungs:     Clear to auscultation bilaterally, respirations unlabored  Heart:    Regular rate and rhythm  Neurologic:   Awake, alert, oriented x 3. No apparent focal neurological           defect.         UC Treatments / Results  Labs (all labs ordered are listed, but only abnormal results are displayed) Labs  Reviewed  POCT RAPID STREP A (OFFICE)    EKG   Radiology No results found.  Procedures Procedures (including critical care time)  Medications Ordered in UC Medications - No data to display  Initial Impression / Assessment and Plan / UC Course  I have reviewed the triage vital signs and the nursing notes.  Pertinent labs & imaging results that were available during my care of the patient were reviewed by me and considered in my medical decision making (see chart for details).    Left ear pain and acute tonsillitis  Treatment per discharge medication orders Rapid strep negative. Throat culture not ordered as patient is being treated. RTC as needed  Final Clinical Impressions(s) / UC Diagnoses   Final diagnoses:  Left ear pain  Acute tonsillitis, unspecified etiology   Discharge Instructions   None    ED Prescriptions     Medication Sig Dispense Auth. Provider   azithromycin (ZITHROMAX) 250 MG tablet Take 2 tabs PO x 1 dose, then 1 tab PO QD x 4 days 6 tablet Bing Neighbors, FNP   predniSONE (DELTASONE) 20 MG tablet Take 1 tablet (20 mg total) by mouth daily with breakfast for 5 days. 5 tablet Bing Neighbors, FNP      PDMP not reviewed this encounter.   Bing Neighbors, FNP 12/19/20 1700

## 2020-12-23 DIAGNOSIS — M542 Cervicalgia: Secondary | ICD-10-CM | POA: Diagnosis not present

## 2020-12-23 DIAGNOSIS — M25512 Pain in left shoulder: Secondary | ICD-10-CM | POA: Diagnosis not present

## 2020-12-23 DIAGNOSIS — M25511 Pain in right shoulder: Secondary | ICD-10-CM | POA: Diagnosis not present

## 2020-12-23 DIAGNOSIS — M791 Myalgia, unspecified site: Secondary | ICD-10-CM | POA: Diagnosis not present

## 2020-12-29 DIAGNOSIS — M791 Myalgia, unspecified site: Secondary | ICD-10-CM | POA: Diagnosis not present

## 2020-12-29 DIAGNOSIS — M25511 Pain in right shoulder: Secondary | ICD-10-CM | POA: Diagnosis not present

## 2020-12-29 DIAGNOSIS — M25512 Pain in left shoulder: Secondary | ICD-10-CM | POA: Diagnosis not present

## 2020-12-29 DIAGNOSIS — M542 Cervicalgia: Secondary | ICD-10-CM | POA: Diagnosis not present

## 2021-01-06 DIAGNOSIS — Z23 Encounter for immunization: Secondary | ICD-10-CM | POA: Diagnosis not present

## 2021-01-06 DIAGNOSIS — Z6838 Body mass index (BMI) 38.0-38.9, adult: Secondary | ICD-10-CM | POA: Diagnosis not present

## 2021-01-06 DIAGNOSIS — M791 Myalgia, unspecified site: Secondary | ICD-10-CM | POA: Diagnosis not present

## 2021-01-06 DIAGNOSIS — M7591 Shoulder lesion, unspecified, right shoulder: Secondary | ICD-10-CM | POA: Diagnosis not present

## 2021-01-06 DIAGNOSIS — Z0001 Encounter for general adult medical examination with abnormal findings: Secondary | ICD-10-CM | POA: Diagnosis not present

## 2021-01-06 DIAGNOSIS — M25511 Pain in right shoulder: Secondary | ICD-10-CM | POA: Diagnosis not present

## 2021-01-06 DIAGNOSIS — Z1331 Encounter for screening for depression: Secondary | ICD-10-CM | POA: Diagnosis not present

## 2021-01-06 DIAGNOSIS — M25512 Pain in left shoulder: Secondary | ICD-10-CM | POA: Diagnosis not present

## 2021-01-06 DIAGNOSIS — M542 Cervicalgia: Secondary | ICD-10-CM | POA: Diagnosis not present

## 2021-01-12 DIAGNOSIS — M791 Myalgia, unspecified site: Secondary | ICD-10-CM | POA: Diagnosis not present

## 2021-01-12 DIAGNOSIS — M542 Cervicalgia: Secondary | ICD-10-CM | POA: Diagnosis not present

## 2021-01-12 DIAGNOSIS — M25511 Pain in right shoulder: Secondary | ICD-10-CM | POA: Diagnosis not present

## 2021-01-12 DIAGNOSIS — M25512 Pain in left shoulder: Secondary | ICD-10-CM | POA: Diagnosis not present

## 2021-01-16 ENCOUNTER — Other Ambulatory Visit: Payer: Self-pay | Admitting: Adult Health

## 2021-01-16 MED ORDER — FLUCONAZOLE 150 MG PO TABS: ORAL_TABLET | ORAL | 1 refills | Status: DC

## 2021-01-16 NOTE — Progress Notes (Signed)
Rx sent for diflucan

## 2021-01-20 DIAGNOSIS — M25512 Pain in left shoulder: Secondary | ICD-10-CM | POA: Diagnosis not present

## 2021-01-20 DIAGNOSIS — M791 Myalgia, unspecified site: Secondary | ICD-10-CM | POA: Diagnosis not present

## 2021-01-20 DIAGNOSIS — M542 Cervicalgia: Secondary | ICD-10-CM | POA: Diagnosis not present

## 2021-01-20 DIAGNOSIS — M25511 Pain in right shoulder: Secondary | ICD-10-CM | POA: Diagnosis not present

## 2021-01-26 DIAGNOSIS — M791 Myalgia, unspecified site: Secondary | ICD-10-CM | POA: Diagnosis not present

## 2021-01-26 DIAGNOSIS — M25512 Pain in left shoulder: Secondary | ICD-10-CM | POA: Diagnosis not present

## 2021-01-26 DIAGNOSIS — M25511 Pain in right shoulder: Secondary | ICD-10-CM | POA: Diagnosis not present

## 2021-01-26 DIAGNOSIS — M542 Cervicalgia: Secondary | ICD-10-CM | POA: Diagnosis not present

## 2021-02-03 DIAGNOSIS — M542 Cervicalgia: Secondary | ICD-10-CM | POA: Diagnosis not present

## 2021-02-03 DIAGNOSIS — M25511 Pain in right shoulder: Secondary | ICD-10-CM | POA: Diagnosis not present

## 2021-02-03 DIAGNOSIS — M791 Myalgia, unspecified site: Secondary | ICD-10-CM | POA: Diagnosis not present

## 2021-02-03 DIAGNOSIS — M25512 Pain in left shoulder: Secondary | ICD-10-CM | POA: Diagnosis not present

## 2021-02-09 DIAGNOSIS — M542 Cervicalgia: Secondary | ICD-10-CM | POA: Diagnosis not present

## 2021-02-09 DIAGNOSIS — M25511 Pain in right shoulder: Secondary | ICD-10-CM | POA: Diagnosis not present

## 2021-02-09 DIAGNOSIS — M791 Myalgia, unspecified site: Secondary | ICD-10-CM | POA: Diagnosis not present

## 2021-02-09 DIAGNOSIS — M25512 Pain in left shoulder: Secondary | ICD-10-CM | POA: Diagnosis not present

## 2021-02-17 DIAGNOSIS — M25512 Pain in left shoulder: Secondary | ICD-10-CM | POA: Diagnosis not present

## 2021-02-17 DIAGNOSIS — M25511 Pain in right shoulder: Secondary | ICD-10-CM | POA: Diagnosis not present

## 2021-02-17 DIAGNOSIS — M791 Myalgia, unspecified site: Secondary | ICD-10-CM | POA: Diagnosis not present

## 2021-02-17 DIAGNOSIS — M542 Cervicalgia: Secondary | ICD-10-CM | POA: Diagnosis not present

## 2021-02-22 ENCOUNTER — Ambulatory Visit (INDEPENDENT_AMBULATORY_CARE_PROVIDER_SITE_OTHER): Payer: BC Managed Care – PPO | Admitting: Obstetrics & Gynecology

## 2021-02-22 ENCOUNTER — Encounter: Payer: Self-pay | Admitting: Obstetrics & Gynecology

## 2021-02-22 ENCOUNTER — Other Ambulatory Visit: Payer: Self-pay

## 2021-02-22 VITALS — BP 128/84 | HR 110 | Ht 66.0 in | Wt 242.4 lb

## 2021-02-22 DIAGNOSIS — Z30431 Encounter for routine checking of intrauterine contraceptive device: Secondary | ICD-10-CM | POA: Diagnosis not present

## 2021-02-22 DIAGNOSIS — R102 Pelvic and perineal pain: Secondary | ICD-10-CM

## 2021-02-22 DIAGNOSIS — N939 Abnormal uterine and vaginal bleeding, unspecified: Secondary | ICD-10-CM

## 2021-02-22 DIAGNOSIS — Z975 Presence of (intrauterine) contraceptive device: Secondary | ICD-10-CM

## 2021-02-22 MED ORDER — ORILISSA 150 MG PO TABS: 150.0000 mg | ORAL_TABLET | Freq: Every day | ORAL | 11 refills | Status: DC

## 2021-02-22 NOTE — Progress Notes (Signed)
   GYN VISIT Patient name: Deanna Harper MRN [id number]  Date of birth: 23-Nov-2000 Chief Complaint:   IUD check  History of Present Illness:   Deanna Harper is a 20 y.o. G0P0000  female being seen today for the following concerns:   Pt reports that she cannot feel the strings.  Of note, pt has had IUD in the past and could not feel like the strings.  Menses are every 27 days and last 11 days.  Initial IUD placed in 2020 to help control her period; however, she has not seen any change in her period. Tried COCs with minimal improvement of her period.  Additionally she notes considerable pelvic pain.  Tried NSAIDs  Mirena placed 11/18/20- strings confirmed proper location 12/16/20 with Joellyn Haff.    Jokingly pt states she would have her uterus out if she didn't want kids in the future.   Patient's last menstrual period was 01/29/2021 (exact date).  Depression screen [facility] 2/9 08/15/2020 07/15/2019  Decreased Interest 0 0  Down, Depressed, Hopeless 0 0  PHQ - 2 Score 0 0  Altered sleeping 1 -  Tired, decreased energy 0 -  Change in appetite 0 -  Feeling bad or failure about yourself  0 -  Trouble concentrating 1 -  Moving slowly or fidgety/restless 0 -  Suicidal thoughts 0 -  PHQ-9 Score 2 -    Review of Systems:   Pertinent items are noted in HPI Denies fever/chills, dizziness, headaches, visual disturbances, fatigue, shortness of breath, chest pain, vomiting, see HPI bowel movements, urination, or intercourse unless otherwise stated above.  Pertinent History Reviewed:  Reviewed past medical,surgical, social, obstetrical and family history.  Reviewed problem list, medications and allergies. Physical Assessment:   Vitals:   02/22/21 1600  BP: 128/84  Pulse: (!) 110  Weight: 242 lb 6.4 oz (110 kg)  Height: 5\' 6"  (1.676 m)  Body mass index is 39.12 kg/m.       Physical Examination:   General appearance: alert, well appearing, and in no distress  Psych: mood appropriate,  normal affect  Skin: warm & dry   Cardiovascular: normal heart rate noted  Respiratory: normal respiratory effort, no distress  Abdomen: soft, non-tender   Pelvic: normal external genitalia, vulva, vagina, cervix- appears normal- very short strings noted with use of the brush  Extremities: no edema  BSUS: Confirmed IUD in proper location, no appearance of migration noted Chaperone: & Plan:  1) Abnormal uterine bleeding/Dysmenorrhea -IUD in proper location -Medical management difficult as she has tried NSAIDs, multiple COCs and now this is her 2nd IUD -prior trial of Orilissa, noted significant improvement- will plan to work on prior authorization for approval   No orders of the defined types were placed in this encounter.   Return in about 3 months (around 05/25/2021) for AUB/IUD follow up.   05/27/2021, DO Attending Obstetrician & Gynecologist, [facility] for [facility]

## 2021-02-23 DIAGNOSIS — M25512 Pain in left shoulder: Secondary | ICD-10-CM | POA: Diagnosis not present

## 2021-02-23 DIAGNOSIS — M25511 Pain in right shoulder: Secondary | ICD-10-CM | POA: Diagnosis not present

## 2021-02-23 DIAGNOSIS — M791 Myalgia, unspecified site: Secondary | ICD-10-CM | POA: Diagnosis not present

## 2021-02-23 DIAGNOSIS — M542 Cervicalgia: Secondary | ICD-10-CM | POA: Diagnosis not present

## 2021-03-01 ENCOUNTER — Telehealth: Payer: Self-pay

## 2021-03-01 DIAGNOSIS — R102 Pelvic and perineal pain: Secondary | ICD-10-CM

## 2021-03-01 DIAGNOSIS — N939 Abnormal uterine and vaginal bleeding, unspecified: Secondary | ICD-10-CM

## 2021-03-01 MED ORDER — ORILISSA 150 MG PO TABS: 150.0000 mg | ORAL_TABLET | Freq: Every day | ORAL | 4 refills | Status: AC

## 2021-03-01 NOTE — Telephone Encounter (Signed)
Plan to change prescription to [facility]

## 2021-03-01 NOTE — Telephone Encounter (Signed)
Contacted pt to let her know PA was approved for [facility]. Pt stated that she works for [employer], which requires all maintenance meds (anything taken longer than 30 days) to be filled by [facility]. She wants to know if it's possible to send the prescription/PA to them. I told her I would send it to the provider and she would change it.

## 2021-03-03 DIAGNOSIS — M25511 Pain in right shoulder: Secondary | ICD-10-CM | POA: Diagnosis not present

## 2021-03-03 DIAGNOSIS — M542 Cervicalgia: Secondary | ICD-10-CM | POA: Diagnosis not present

## 2021-03-03 DIAGNOSIS — M791 Myalgia, unspecified site: Secondary | ICD-10-CM | POA: Diagnosis not present

## 2021-03-03 DIAGNOSIS — M25512 Pain in left shoulder: Secondary | ICD-10-CM | POA: Diagnosis not present

## 2021-03-09 DIAGNOSIS — M542 Cervicalgia: Secondary | ICD-10-CM | POA: Diagnosis not present

## 2021-03-09 DIAGNOSIS — M25512 Pain in left shoulder: Secondary | ICD-10-CM | POA: Diagnosis not present

## 2021-03-09 DIAGNOSIS — M791 Myalgia, unspecified site: Secondary | ICD-10-CM | POA: Diagnosis not present

## 2021-03-09 DIAGNOSIS — M25511 Pain in right shoulder: Secondary | ICD-10-CM | POA: Diagnosis not present

## 2021-03-17 DIAGNOSIS — M542 Cervicalgia: Secondary | ICD-10-CM | POA: Diagnosis not present

## 2021-03-17 DIAGNOSIS — M25512 Pain in left shoulder: Secondary | ICD-10-CM | POA: Diagnosis not present

## 2021-03-17 DIAGNOSIS — M791 Myalgia, unspecified site: Secondary | ICD-10-CM | POA: Diagnosis not present

## 2021-03-17 DIAGNOSIS — M25511 Pain in right shoulder: Secondary | ICD-10-CM | POA: Diagnosis not present

## 2021-03-21 DIAGNOSIS — M791 Myalgia, unspecified site: Secondary | ICD-10-CM | POA: Diagnosis not present

## 2021-03-21 DIAGNOSIS — M25512 Pain in left shoulder: Secondary | ICD-10-CM | POA: Diagnosis not present

## 2021-03-21 DIAGNOSIS — M25511 Pain in right shoulder: Secondary | ICD-10-CM | POA: Diagnosis not present

## 2021-03-21 DIAGNOSIS — M542 Cervicalgia: Secondary | ICD-10-CM | POA: Diagnosis not present

## 2021-03-22 ENCOUNTER — Ambulatory Visit: Payer: BC Managed Care – PPO | Admitting: Orthopedic Surgery

## 2021-03-31 ENCOUNTER — Ambulatory Visit (INDEPENDENT_AMBULATORY_CARE_PROVIDER_SITE_OTHER): Payer: BC Managed Care – PPO | Admitting: Orthopedic Surgery

## 2021-03-31 ENCOUNTER — Ambulatory Visit: Payer: BC Managed Care – PPO

## 2021-03-31 ENCOUNTER — Encounter: Payer: Self-pay | Admitting: Orthopedic Surgery

## 2021-03-31 ENCOUNTER — Other Ambulatory Visit: Payer: Self-pay

## 2021-03-31 VITALS — BP 143/94 | HR 90 | Ht 66.0 in | Wt 236.2 lb

## 2021-03-31 DIAGNOSIS — G8929 Other chronic pain: Secondary | ICD-10-CM

## 2021-03-31 DIAGNOSIS — M25512 Pain in left shoulder: Secondary | ICD-10-CM | POA: Diagnosis not present

## 2021-03-31 DIAGNOSIS — M629 Disorder of muscle, unspecified: Secondary | ICD-10-CM

## 2021-03-31 DIAGNOSIS — M542 Cervicalgia: Secondary | ICD-10-CM

## 2021-03-31 MED ORDER — CELECOXIB 100 MG PO CAPS: 100.0000 mg | ORAL_CAPSULE | Freq: Two times a day (BID) | ORAL | 0 refills | Status: AC

## 2021-03-31 MED ORDER — METHOCARBAMOL 500 MG PO TABS: 500.0000 mg | ORAL_TABLET | Freq: Two times a day (BID) | ORAL | 0 refills | Status: AC | PRN

## 2021-03-31 NOTE — Progress Notes (Signed)
[facility]th[facility] Clinic Return  Assessment: Deanna Harper is a 20 y.o. female with the following: Left sided neck and posterior shoulder pain, chronic; normal cervical spine x-rays  Plan: At this point, I am not certain what is causing her symptoms.  She has worked with physical therapy for several months, as well as with a massage therapist.  She is done multiple modalities in therapy, including dry needling.  She has had minimal resolution of her symptoms.  She reports that the pain is transiently better, but after 1-2 days following treatment, the pain returns.  On physical exam, the left trapezius is much more rigid than the right trapezius.  There is also tenderness to palpation within the muscle.  Her range of motion in both shoulders is normal, as is her strength.  Cervical spine x-rays were obtained in clinic today, and aside from the loss of normal cervical curvature, there are no obvious signs of pathology or segmental anomalies.  As a result, I do not think an MRI is warranted.  We discussed multiple treatment options, including focusing on strengthening and stretching of the neck, as well as some Celebrex for pain, and methocarbamol for a muscle relaxer.  We may have to consider a referral for further evaluation, with the possibility that someone has dealt with these symptoms in the past, or I am missing something on exam.  She stated her understanding.  Follow-up as needed.  Meds ordered this encounter  Medications   celecoxib (CELEBREX) 100 MG capsule    Sig: Take 1 capsule (100 mg total) by mouth 2 (two) times daily.    Dispense:  60 capsule    Refill:  0   methocarbamol (ROBAXIN) 500 MG tablet    Sig: Take 1 tablet (500 mg total) by mouth 2 (two) times daily as needed for muscle spasms.    Dispense:  10 tablet    Refill:  0    Body mass index is 38.12 kg/m.  Follow-up: Return if symptoms worsen or fail to improve.   Subjective:  Chief Complaint  Patient presents with    Shoulder Pain    Recheck on left shoulder pain    History of Present Illness: Deanna Harper is a 20 y.o. female who returns to clinic for repeat evaluation of her left-sided neck and shoulder pain.  She was last seen in clinic approximately 4 months ago.  Since then, she been working with physical therapy.  She has been getting regular massages, and is doing some modalities with her physical therapist.  She reports that some of these modalities improve her symptoms but this returns in 24-48 hours.  She felt a lot better after taking a Dosepak of prednisone.  However, once the Dosepak was done, her pain returned.  She does not like taking ibuprofen, as this causes migraines.  Most of the time, her pain is better in the morning, but throughout the day, the pain gets worse.  Occasionally, she wakes up and has pain in the left side of her neck.  Review of Systems: No fevers or chills No numbness or tingling No chest pain No shortness of breath No bowel or bladder dysfunction No GI distress No headaches   Objective: BP (!) 143/94   Pulse 90   Ht 5\' 6"  (1.676 m)   Wt 236 lb 3.2 oz (107.1 kg)   LMP 03/03/2021 (Approximate)   BMI 38.12 kg/m   Physical Exam:  Alert and oriented.  No acute distress.  Evaluation of  the neck demonstrates no deformity.  She has some tenderness to palpation on the left side of her neck, and into the trapezius.  Tenderness to palpation within the trapezius.  The left trapezius has increased muscle tone compared to the right.  She also has some tenderness to palpation along the medial border of the scapula.  Bilateral shoulder range of motion and strength is normal, and equal bilaterally.  Fingers warm and well-perfused.  Sensations intact throughout all nerve distributions.  IMAGING: I personally ordered and reviewed the following images:  X-rays of the cervical spine were obtained in clinic today, and demonstrates no acute injury.  Overall alignment is  neutral.  She has loss of the normal curvature, and is trending towards kyphosis.  No evidence of anterolisthesis.  Well-maintained disc heights.  Minimal osteophytes are noted.  Impression: Cervical spine x-rays without acute injury, loss of natural curvature   Oliver Barre, MD 03/31/2021 12:02 PM

## 2021-06-09 ENCOUNTER — Encounter: Payer: Self-pay | Admitting: Obstetrics & Gynecology

## 2021-06-09 ENCOUNTER — Ambulatory Visit (INDEPENDENT_AMBULATORY_CARE_PROVIDER_SITE_OTHER): Payer: BC Managed Care – PPO | Admitting: Obstetrics & Gynecology

## 2021-06-09 ENCOUNTER — Other Ambulatory Visit: Payer: Self-pay

## 2021-06-09 VITALS — BP 129/89 | HR 81 | Ht 66.0 in | Wt 234.4 lb

## 2021-06-09 DIAGNOSIS — Z975 Presence of (intrauterine) contraceptive device: Secondary | ICD-10-CM | POA: Diagnosis not present

## 2021-06-09 DIAGNOSIS — N939 Abnormal uterine and vaginal bleeding, unspecified: Secondary | ICD-10-CM | POA: Diagnosis not present

## 2021-06-09 NOTE — Progress Notes (Signed)
° °  GYN VISIT Patient name: Deanna Harper MRN [id number]  Date of birth: [birth year] Chief Complaint:   Contraception (IUD check)  History of Present Illness:   Deanna Harper is a 21 y.o. G0P0000 female being seen today for follow up regarding:  AUB: At her last visit, there was concern that the IUD had not improved her bleeding as her periods were still lasting 11 days.  Plan was to start on Orilissa as this had worked in the past.  Mirena placed 11/18/2020  Today she notes that in January she had bleeding x 25 days- never heavy, at the worst 2 pads per day.  Then no bleeding x 3 weeks.  Then started last Saturday, but super light- only wearing pantyliner.    Denies significant dysmenorrhea- occasional cramp that is very brief.  Pain does not require medication.  No other acute complaints  Started Deanna Harper in November- this makes Month #4  Patient's last menstrual period was 06/03/2021 (exact date).  Depression screen [facility] 2/9 08/15/2020 07/15/2019  Decreased Interest 0 0  Down, Depressed, Hopeless 0 0  PHQ - 2 Score 0 0  Altered sleeping 1 -  Tired, decreased energy 0 -  Change in appetite 0 -  Feeling bad or failure about yourself  0 -  Trouble concentrating 1 -  Moving slowly or fidgety/restless 0 -  Suicidal thoughts 0 -  PHQ-9 Score 2 -     Review of Systems:   Pertinent items are noted in HPI Denies fever/chills, dizziness, headaches, visual disturbances, fatigue, shortness of breath, chest pain, abdominal pain, vomiting, bowel movements, urination, or intercourse unless otherwise stated above.  Pertinent History Reviewed:  Reviewed past medical,surgical, social, obstetrical and family history.  Reviewed problem list, medications and allergies. Physical Assessment:   Vitals:   06/09/21 0907  BP: 129/89  Pulse: 81  Weight: 234 lb 6.4 oz (106.3 kg)  Height: 5\' 6"  (1.676 m)  Body mass index is 37.83 kg/m.       Physical Examination:   General appearance: alert,  well appearing, and in no distress  Psych: mood appropriate, normal affect  Skin: warm & dry   Cardiovascular: normal heart rate noted  Respiratory: normal respiratory effort, no distress  Abdomen: soft, non-tender   Pelvic: VULVA: normal appearing vulva with no masses, tenderness or lesions, VAGINA: normal appearing vagina with normal color and discharge, no lesions- minimal brown spotting/mucus noted CERVIX: normal appearing cervix without discharge or lesions, IUD strings visualized  Extremities: no edema, no calf tenderness bilaterally  Chaperone:  pt declined     Assessment & Plan:  1) AUB -doing better with , plan to continue x 83mos (stop date- Nov 2024) -IUD in place and strings in proper location -suspect bleeding will continue to improve  F/U in 86yr for annual    3yr, DO Attending Obstetrician & Gynecologist, [facility] for [employer], clinical, [facility]

## 2021-06-22 DIAGNOSIS — M542 Cervicalgia: Secondary | ICD-10-CM | POA: Diagnosis not present

## 2021-06-22 DIAGNOSIS — M25511 Pain in right shoulder: Secondary | ICD-10-CM | POA: Diagnosis not present

## 2021-06-22 DIAGNOSIS — M791 Myalgia, unspecified site: Secondary | ICD-10-CM | POA: Diagnosis not present

## 2021-06-22 DIAGNOSIS — M25512 Pain in left shoulder: Secondary | ICD-10-CM | POA: Diagnosis not present

## 2021-07-10 DIAGNOSIS — M25511 Pain in right shoulder: Secondary | ICD-10-CM | POA: Diagnosis not present

## 2021-07-10 DIAGNOSIS — M25512 Pain in left shoulder: Secondary | ICD-10-CM | POA: Diagnosis not present

## 2021-07-10 DIAGNOSIS — M542 Cervicalgia: Secondary | ICD-10-CM | POA: Diagnosis not present

## 2021-07-10 DIAGNOSIS — M791 Myalgia, unspecified site: Secondary | ICD-10-CM | POA: Diagnosis not present

## 2021-08-03 ENCOUNTER — Telehealth: Payer: Self-pay | Admitting: *Deleted

## 2021-08-03 ENCOUNTER — Encounter: Payer: Self-pay | Admitting: Obstetrics & Gynecology

## 2021-08-03 NOTE — Telephone Encounter (Signed)
Left message @ 10:52 am letting pt know her insurance has approved Orilissa through 01/31/22. Need to know what drug store pt uses. JSY ?

## 2021-10-12 ENCOUNTER — Encounter: Payer: Self-pay | Admitting: Obstetrics & Gynecology

## 2021-10-23 ENCOUNTER — Telehealth: Payer: Self-pay | Admitting: Internal Medicine

## 2021-10-23 NOTE — Telephone Encounter (Signed)
[facility] called in regard to pre auth for patient med.  Elagolix Sodium (ORILISSA) 150 MG TABS

## 2021-10-30 NOTE — Telephone Encounter (Signed)
PA completed on Friday June 30. Pt aware.

## 2021-11-01 ENCOUNTER — Encounter: Payer: Self-pay | Admitting: *Deleted

## 2022-01-02 ENCOUNTER — Encounter: Payer: Self-pay | Admitting: *Deleted

## 2022-05-05 IMAGING — DX DG KNEE COMPLETE 4+V*L*
4 series · 4 of 4 positions shown · non-contrast
Comparison: None.

CLINICAL DATA: Pain following fall

EXAM:
LEFT KNEE - COMPLETE 4+ VIEW

[knee ap]
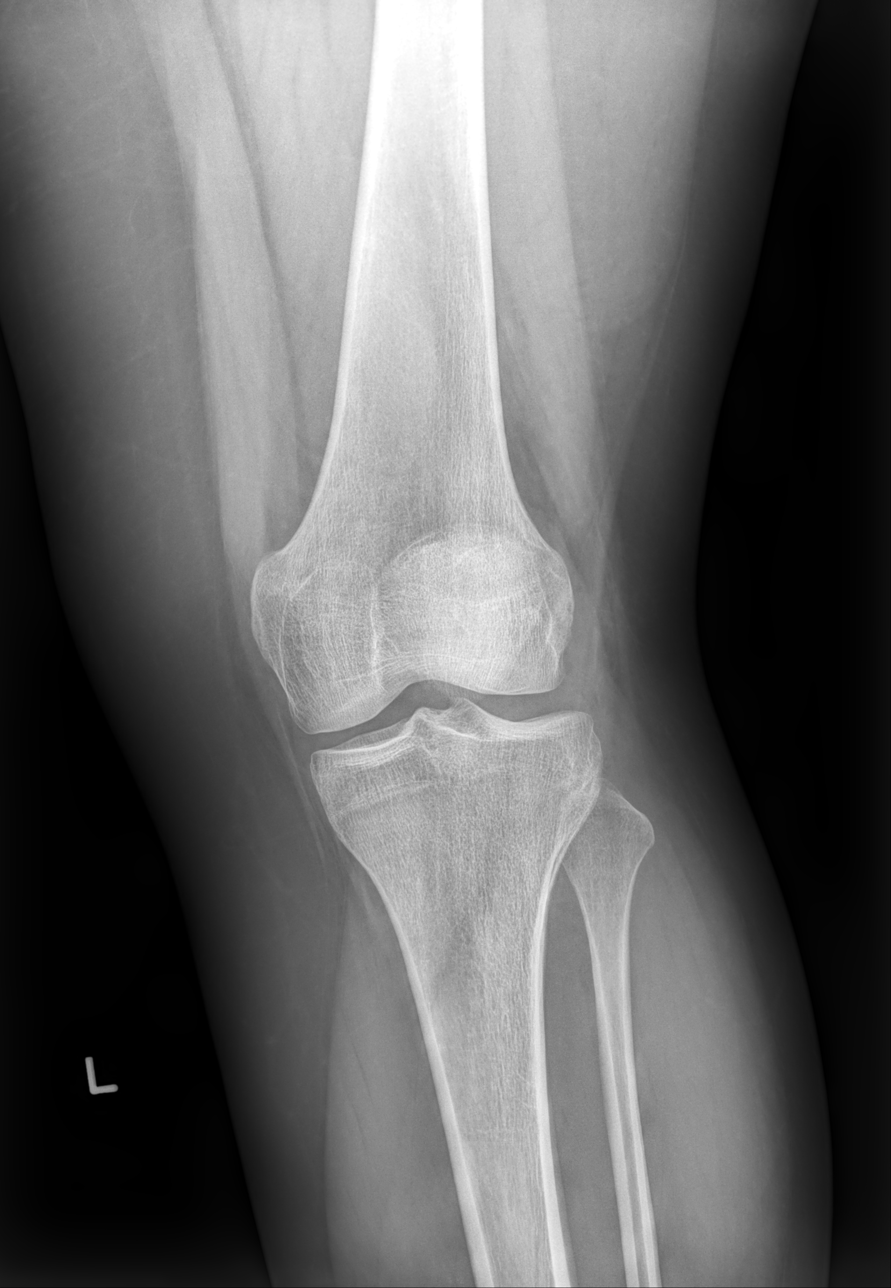

[knee mlo (1 of 2)]
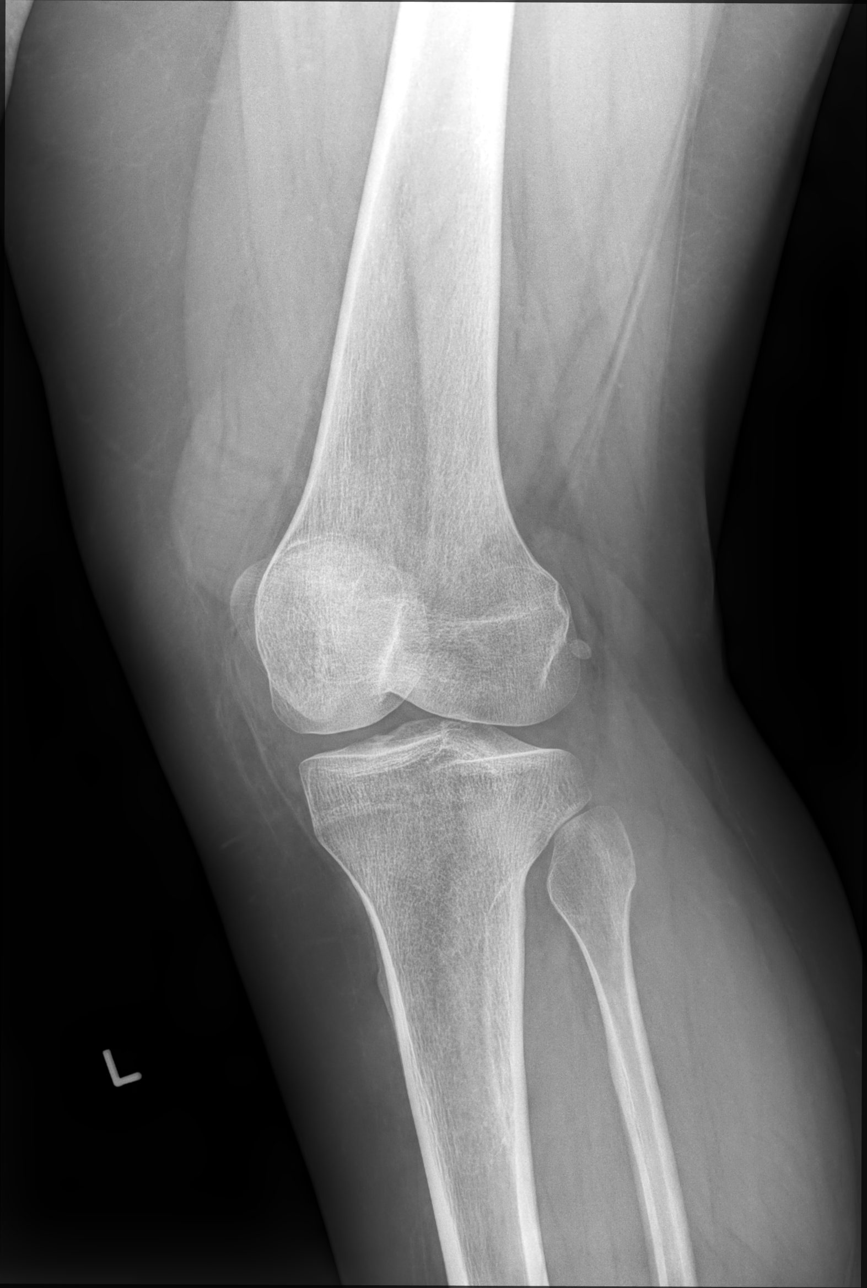

[knee mlo (2 of 2)]
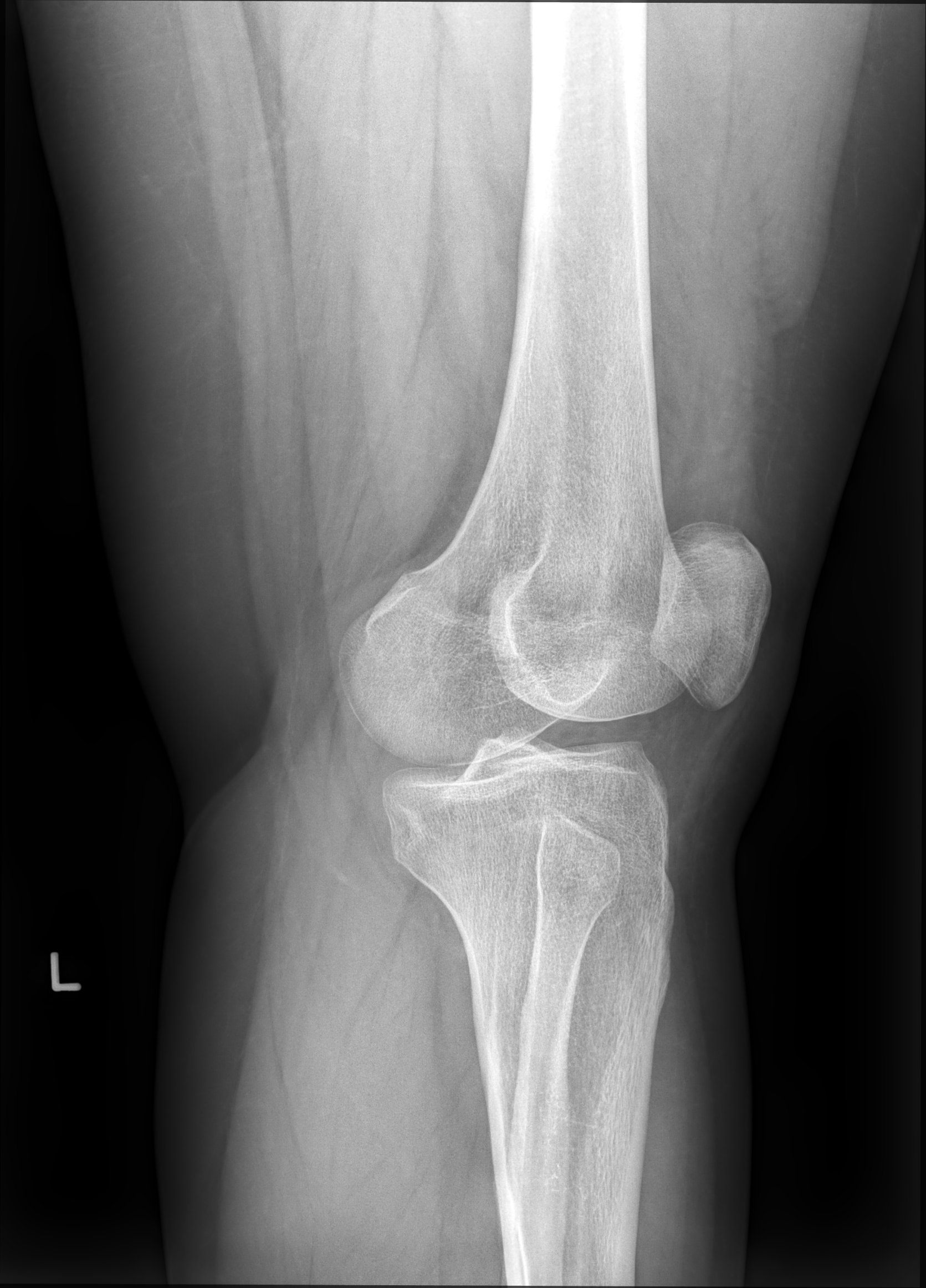

[knee lat]
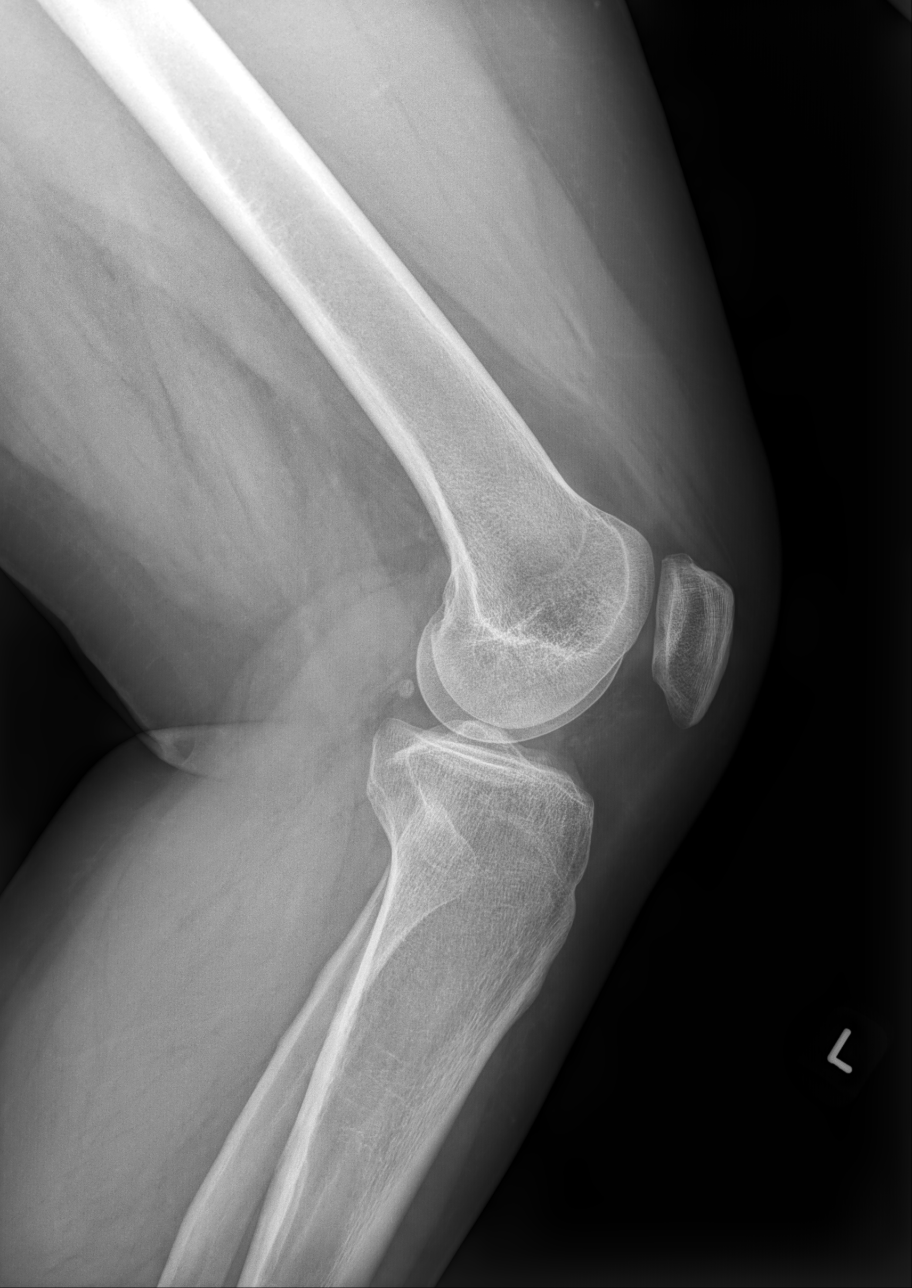

[4 of 4 positions shown; findings below may reference images not displayed]

FINDINGS: Frontal, lateral, and bilateral oblique views were obtained. No
fracture or dislocation. No joint effusion. Joint spaces appear
normal. No erosive change.
IMPRESSION: No fracture, dislocation, or joint effusion. No evident arthropathy.

## 2022-06-28 ENCOUNTER — Encounter: Payer: Self-pay | Admitting: Radiology

## 2023-11-28 ENCOUNTER — Other Ambulatory Visit: Payer: Self-pay | Admitting: Medical Genetics

## 2023-11-29 ENCOUNTER — Other Ambulatory Visit (HOSPITAL_COMMUNITY)

## 2023-11-29 ENCOUNTER — Other Ambulatory Visit (HOSPITAL_COMMUNITY)
Admission: RE | Admit: 2023-11-29 | Discharge: 2023-11-29 | Disposition: A | Discharge status: Home / Self Care | Payer: Self-pay | Source: Ambulatory Visit | Attending: Oncology | Admitting: Oncology

## 2023-12-09 LAB — GENECONNECT MOLECULAR SCREEN: Genetic Analysis Overall Interpretation: NEGATIVE
